# Patient Record
Sex: Male | Born: 1965
Health system: Southern US, Community
[De-identification: ages and names within clinical notes are randomized; demographics above are authoritative.]

## PROBLEM LIST (undated history)

## (undated) DIAGNOSIS — C4490 Unspecified malignant neoplasm of skin, unspecified: Secondary | ICD-10-CM

## (undated) DIAGNOSIS — F419 Anxiety disorder, unspecified: Secondary | ICD-10-CM

## (undated) DIAGNOSIS — G894 Chronic pain syndrome: Secondary | ICD-10-CM

## (undated) DIAGNOSIS — K219 Gastro-esophageal reflux disease without esophagitis: Secondary | ICD-10-CM

## (undated) DIAGNOSIS — I10 Essential (primary) hypertension: Secondary | ICD-10-CM

## (undated) HISTORY — PX: HERNIA REPAIR: SHX51

## (undated) HISTORY — DX: Essential (primary) hypertension: I10

## (undated) HISTORY — PX: ROTATOR CUFF REPAIR: SHX139

## (undated) HISTORY — DX: Anxiety disorder, unspecified: F41.9

## (undated) HISTORY — PX: OTHER SURGICAL HISTORY: SHX169

## (undated) HISTORY — PX: SPINAL FUSION: SHX223

## (undated) HISTORY — DX: Chronic pain syndrome: G89.4

## (undated) HISTORY — DX: Unspecified malignant neoplasm of skin, unspecified: C44.90

## (undated) HISTORY — DX: Gastro-esophageal reflux disease without esophagitis: K21.9

---

## 1998-07-28 ENCOUNTER — Encounter: Admission: RE | Admit: 1998-07-28 | Discharge: 1998-07-28 | Payer: Self-pay | Admitting: *Deleted

## 1998-07-28 ENCOUNTER — Encounter: Payer: Self-pay | Admitting: Emergency Medicine

## 1998-07-28 ENCOUNTER — Emergency Department (HOSPITAL_COMMUNITY): Admission: EM | Admit: 1998-07-28 | Discharge: 1998-07-28 | Payer: Self-pay | Admitting: Emergency Medicine

## 1998-07-29 ENCOUNTER — Encounter: Admission: RE | Admit: 1998-07-29 | Discharge: 1998-10-27 | Payer: Self-pay | Admitting: *Deleted

## 1998-11-15 ENCOUNTER — Ambulatory Visit (HOSPITAL_BASED_OUTPATIENT_CLINIC_OR_DEPARTMENT_OTHER): Admission: RE | Admit: 1998-11-15 | Discharge: 1998-11-15 | Payer: Self-pay | Admitting: General Surgery

## 1999-05-09 ENCOUNTER — Encounter: Admission: RE | Admit: 1999-05-09 | Discharge: 1999-08-07 | Payer: Self-pay | Admitting: Anesthesiology

## 1999-10-03 ENCOUNTER — Encounter: Admission: RE | Admit: 1999-10-03 | Discharge: 1999-10-03 | Payer: Self-pay | Admitting: Family Medicine

## 1999-10-03 ENCOUNTER — Encounter: Payer: Self-pay | Admitting: Family Medicine

## 2000-05-15 ENCOUNTER — Encounter: Payer: Self-pay | Admitting: Family Medicine

## 2000-05-15 ENCOUNTER — Encounter: Admission: RE | Admit: 2000-05-15 | Discharge: 2000-05-15 | Payer: Self-pay | Admitting: Family Medicine

## 2001-07-11 ENCOUNTER — Encounter: Admission: RE | Admit: 2001-07-11 | Discharge: 2001-07-11 | Payer: Self-pay | Admitting: *Deleted

## 2001-07-29 ENCOUNTER — Encounter: Admission: RE | Admit: 2001-07-29 | Discharge: 2001-07-29 | Payer: Self-pay | Admitting: *Deleted

## 2001-08-12 ENCOUNTER — Encounter: Admission: RE | Admit: 2001-08-12 | Discharge: 2001-08-12 | Payer: Self-pay | Admitting: *Deleted

## 2001-08-22 ENCOUNTER — Encounter: Payer: Self-pay | Admitting: Emergency Medicine

## 2001-08-22 ENCOUNTER — Emergency Department (HOSPITAL_COMMUNITY): Admission: EM | Admit: 2001-08-22 | Discharge: 2001-08-23 | Payer: Self-pay | Admitting: Emergency Medicine

## 2001-08-26 ENCOUNTER — Ambulatory Visit (HOSPITAL_COMMUNITY): Admission: RE | Admit: 2001-08-26 | Discharge: 2001-08-26 | Payer: Self-pay | Admitting: *Deleted

## 2001-08-28 HISTORY — PX: CORONARY ANGIOPLASTY: SHX604

## 2001-10-28 ENCOUNTER — Encounter: Admission: RE | Admit: 2001-10-28 | Discharge: 2001-10-28 | Payer: Self-pay | Admitting: Cardiology

## 2001-10-28 ENCOUNTER — Encounter: Payer: Self-pay | Admitting: Cardiology

## 2001-12-16 ENCOUNTER — Encounter: Admission: RE | Admit: 2001-12-16 | Discharge: 2001-12-16 | Payer: Self-pay | Admitting: *Deleted

## 2001-12-16 ENCOUNTER — Emergency Department (HOSPITAL_COMMUNITY): Admission: EM | Admit: 2001-12-16 | Discharge: 2001-12-17 | Payer: Self-pay | Admitting: Emergency Medicine

## 2001-12-16 ENCOUNTER — Encounter: Payer: Self-pay | Admitting: Emergency Medicine

## 2001-12-19 ENCOUNTER — Encounter: Admission: RE | Admit: 2001-12-19 | Discharge: 2001-12-19 | Payer: Self-pay | Admitting: Internal Medicine

## 2002-02-03 ENCOUNTER — Encounter: Admission: RE | Admit: 2002-02-03 | Discharge: 2002-02-03 | Payer: Self-pay | Admitting: Infectious Diseases

## 2002-03-29 ENCOUNTER — Ambulatory Visit (HOSPITAL_COMMUNITY): Admission: RE | Admit: 2002-03-29 | Discharge: 2002-03-29 | Payer: Self-pay | Admitting: *Deleted

## 2002-06-26 ENCOUNTER — Ambulatory Visit (HOSPITAL_COMMUNITY): Admission: RE | Admit: 2002-06-26 | Discharge: 2002-06-26 | Payer: Self-pay | Admitting: *Deleted

## 2003-03-19 ENCOUNTER — Ambulatory Visit (HOSPITAL_COMMUNITY): Admission: RE | Admit: 2003-03-19 | Discharge: 2003-03-19 | Payer: Self-pay | Admitting: Geriatric Medicine

## 2003-09-02 HISTORY — PX: ESOPHAGOGASTRODUODENOSCOPY: SHX1529

## 2003-09-15 ENCOUNTER — Ambulatory Visit (HOSPITAL_COMMUNITY): Admission: RE | Admit: 2003-09-15 | Discharge: 2003-09-15 | Payer: Self-pay | Admitting: Family Medicine

## 2003-09-22 HISTORY — PX: COLONOSCOPY: SHX174

## 2003-09-25 ENCOUNTER — Ambulatory Visit (HOSPITAL_COMMUNITY): Admission: RE | Admit: 2003-09-25 | Discharge: 2003-09-25 | Payer: Self-pay | Admitting: *Deleted

## 2003-10-02 ENCOUNTER — Ambulatory Visit (HOSPITAL_COMMUNITY): Admission: RE | Admit: 2003-10-02 | Discharge: 2003-10-02 | Payer: Self-pay | Admitting: Family Medicine

## 2003-10-15 ENCOUNTER — Ambulatory Visit (HOSPITAL_COMMUNITY): Admission: RE | Admit: 2003-10-15 | Discharge: 2003-10-15 | Payer: Self-pay | Admitting: Internal Medicine

## 2003-10-27 ENCOUNTER — Ambulatory Visit (HOSPITAL_COMMUNITY): Admission: RE | Admit: 2003-10-27 | Discharge: 2003-10-27 | Payer: Self-pay | Admitting: Internal Medicine

## 2003-11-23 ENCOUNTER — Ambulatory Visit (HOSPITAL_COMMUNITY): Admission: RE | Admit: 2003-11-23 | Discharge: 2003-11-23 | Payer: Self-pay | Admitting: Internal Medicine

## 2003-11-28 ENCOUNTER — Ambulatory Visit (HOSPITAL_COMMUNITY): Admission: RE | Admit: 2003-11-28 | Discharge: 2003-11-28 | Payer: Self-pay | Admitting: Internal Medicine

## 2004-07-12 ENCOUNTER — Ambulatory Visit: Payer: Self-pay | Admitting: Family Medicine

## 2004-10-11 ENCOUNTER — Ambulatory Visit: Payer: Self-pay | Admitting: Family Medicine

## 2004-11-17 ENCOUNTER — Ambulatory Visit: Payer: Self-pay | Admitting: Family Medicine

## 2004-11-24 ENCOUNTER — Ambulatory Visit: Payer: Self-pay | Admitting: Family Medicine

## 2005-05-09 ENCOUNTER — Ambulatory Visit: Payer: Self-pay | Admitting: Family Medicine

## 2005-07-07 ENCOUNTER — Ambulatory Visit: Payer: Self-pay | Admitting: Family Medicine

## 2005-09-20 ENCOUNTER — Ambulatory Visit: Payer: Self-pay | Admitting: Family Medicine

## 2005-12-05 ENCOUNTER — Ambulatory Visit (HOSPITAL_BASED_OUTPATIENT_CLINIC_OR_DEPARTMENT_OTHER): Admission: RE | Admit: 2005-12-05 | Discharge: 2005-12-05 | Payer: Self-pay | Admitting: Orthopaedic Surgery

## 2006-01-15 ENCOUNTER — Ambulatory Visit: Payer: Self-pay | Admitting: Family Medicine

## 2006-01-25 ENCOUNTER — Ambulatory Visit: Payer: Self-pay | Admitting: Family Medicine

## 2006-03-07 ENCOUNTER — Encounter: Payer: Self-pay | Admitting: Family Medicine

## 2006-06-06 ENCOUNTER — Ambulatory Visit: Payer: Self-pay | Admitting: Family Medicine

## 2006-08-03 ENCOUNTER — Ambulatory Visit: Payer: Self-pay | Admitting: Family Medicine

## 2006-09-17 ENCOUNTER — Ambulatory Visit: Payer: Self-pay | Admitting: Family Medicine

## 2006-10-02 ENCOUNTER — Ambulatory Visit: Payer: Self-pay | Admitting: Family Medicine

## 2006-10-12 ENCOUNTER — Ambulatory Visit: Payer: Self-pay | Admitting: Family Medicine

## 2006-11-01 ENCOUNTER — Ambulatory Visit: Payer: Self-pay | Admitting: Family Medicine

## 2007-01-10 ENCOUNTER — Ambulatory Visit: Payer: Self-pay | Admitting: Family Medicine

## 2007-02-22 ENCOUNTER — Ambulatory Visit: Payer: Self-pay | Admitting: Physical Medicine & Rehabilitation

## 2007-02-22 ENCOUNTER — Encounter
Admission: RE | Admit: 2007-02-22 | Discharge: 2007-05-23 | Payer: Self-pay | Admitting: Physical Medicine & Rehabilitation

## 2007-04-04 ENCOUNTER — Ambulatory Visit: Payer: Self-pay | Admitting: Physical Medicine & Rehabilitation

## 2007-05-01 ENCOUNTER — Ambulatory Visit (HOSPITAL_COMMUNITY)
Admission: RE | Admit: 2007-05-01 | Discharge: 2007-05-01 | Payer: Self-pay | Admitting: Physical Medicine & Rehabilitation

## 2007-05-15 ENCOUNTER — Ambulatory Visit: Payer: Self-pay | Admitting: Physical Medicine & Rehabilitation

## 2007-05-23 ENCOUNTER — Encounter
Admission: RE | Admit: 2007-05-23 | Discharge: 2007-05-23 | Payer: Self-pay | Admitting: Physical Medicine & Rehabilitation

## 2007-06-10 ENCOUNTER — Encounter: Admission: RE | Admit: 2007-06-10 | Discharge: 2007-09-08 | Payer: Self-pay | Admitting: Anesthesiology

## 2007-06-11 ENCOUNTER — Ambulatory Visit: Payer: Self-pay | Admitting: Anesthesiology

## 2007-06-12 DIAGNOSIS — I10 Essential (primary) hypertension: Secondary | ICD-10-CM

## 2007-06-18 ENCOUNTER — Encounter
Admission: RE | Admit: 2007-06-18 | Discharge: 2007-06-19 | Payer: Self-pay | Admitting: Physical Medicine & Rehabilitation

## 2007-06-18 ENCOUNTER — Ambulatory Visit: Payer: Self-pay | Admitting: Physical Medicine & Rehabilitation

## 2007-06-27 ENCOUNTER — Ambulatory Visit: Payer: Self-pay | Admitting: Family Medicine

## 2007-06-28 ENCOUNTER — Encounter: Payer: Self-pay | Admitting: Family Medicine

## 2007-07-01 LAB — CONVERTED CEMR LAB
ALT: 25 units/L (ref 0–53)
AST: 16 units/L (ref 0–37)
Basophils Relative: 0.8 % (ref 0.0–1.0)
Bilirubin, Direct: 0.2 mg/dL (ref 0.0–0.3)
CO2: 32 meq/L (ref 19–32)
Calcium: 10.3 mg/dL (ref 8.4–10.5)
Chloride: 103 meq/L (ref 96–112)
Eosinophils Relative: 1 % (ref 0.0–5.0)
Glucose, Bld: 86 mg/dL (ref 70–99)
Platelets: 311 10*3/uL (ref 150–400)
RBC: 5.22 M/uL (ref 4.22–5.81)
RDW: 12.1 % (ref 11.5–14.6)
Total Protein: 7.2 g/dL (ref 6.0–8.3)
WBC: 8.9 10*3/uL (ref 4.5–10.5)

## 2007-08-13 ENCOUNTER — Ambulatory Visit: Payer: Self-pay | Admitting: Anesthesiology

## 2007-09-20 ENCOUNTER — Ambulatory Visit: Payer: Self-pay | Admitting: Family Medicine

## 2007-09-20 DIAGNOSIS — K219 Gastro-esophageal reflux disease without esophagitis: Secondary | ICD-10-CM

## 2007-09-20 DIAGNOSIS — F411 Generalized anxiety disorder: Secondary | ICD-10-CM

## 2007-09-20 DIAGNOSIS — R079 Chest pain, unspecified: Secondary | ICD-10-CM

## 2007-09-20 DIAGNOSIS — M546 Pain in thoracic spine: Secondary | ICD-10-CM

## 2007-09-26 ENCOUNTER — Encounter: Admission: RE | Admit: 2007-09-26 | Discharge: 2007-09-26 | Payer: Self-pay | Admitting: Family Medicine

## 2007-09-27 ENCOUNTER — Telehealth: Payer: Self-pay | Admitting: Family Medicine

## 2007-10-02 ENCOUNTER — Encounter: Payer: Self-pay | Admitting: Family Medicine

## 2007-10-14 ENCOUNTER — Encounter: Admission: RE | Admit: 2007-10-14 | Discharge: 2007-10-14 | Payer: Self-pay | Admitting: Orthopaedic Surgery

## 2007-11-11 ENCOUNTER — Telehealth: Payer: Self-pay | Admitting: Family Medicine

## 2007-11-19 ENCOUNTER — Telehealth (INDEPENDENT_AMBULATORY_CARE_PROVIDER_SITE_OTHER): Payer: Self-pay | Admitting: *Deleted

## 2007-11-27 ENCOUNTER — Encounter: Payer: Self-pay | Admitting: Family Medicine

## 2007-12-18 ENCOUNTER — Encounter: Payer: Self-pay | Admitting: Family Medicine

## 2007-12-26 ENCOUNTER — Ambulatory Visit: Payer: Self-pay | Admitting: Family Medicine

## 2008-01-09 ENCOUNTER — Encounter: Payer: Self-pay | Admitting: Family Medicine

## 2008-01-22 ENCOUNTER — Encounter: Payer: Self-pay | Admitting: Family Medicine

## 2008-03-04 ENCOUNTER — Encounter: Payer: Self-pay | Admitting: Family Medicine

## 2008-04-15 ENCOUNTER — Encounter: Payer: Self-pay | Admitting: Family Medicine

## 2008-07-09 ENCOUNTER — Encounter: Payer: Self-pay | Admitting: Family Medicine

## 2008-08-11 ENCOUNTER — Encounter: Payer: Self-pay | Admitting: Family Medicine

## 2008-10-14 ENCOUNTER — Encounter: Admission: RE | Admit: 2008-10-14 | Discharge: 2008-10-14 | Payer: Self-pay | Admitting: Orthopaedic Surgery

## 2008-10-23 ENCOUNTER — Telehealth: Payer: Self-pay | Admitting: Family Medicine

## 2008-12-10 ENCOUNTER — Ambulatory Visit: Payer: Self-pay | Admitting: Family Medicine

## 2008-12-10 LAB — CONVERTED CEMR LAB
Bilirubin Urine: NEGATIVE
Blood in Urine, dipstick: NEGATIVE
Glucose, Urine, Semiquant: NEGATIVE
Ketones, urine, test strip: NEGATIVE
Nitrite: NEGATIVE
Protein, U semiquant: NEGATIVE
Specific Gravity, Urine: 1.015
Urobilinogen, UA: 0.2
WBC Urine, dipstick: NEGATIVE
pH: 7.5

## 2008-12-14 LAB — CONVERTED CEMR LAB
AST: 20 units/L (ref 0–37)
Alkaline Phosphatase: 56 units/L (ref 39–117)
Basophils Absolute: 0 10*3/uL (ref 0.0–0.1)
Bilirubin, Direct: 0.1 mg/dL (ref 0.0–0.3)
Calcium: 9.4 mg/dL (ref 8.4–10.5)
Creatinine, Ser: 0.8 mg/dL (ref 0.4–1.5)
Eosinophils Absolute: 0.1 10*3/uL (ref 0.0–0.7)
GFR calc non Af Amer: 111.98 mL/min (ref >60)
Glucose, Bld: 96 mg/dL (ref 70–99)
HDL: 41.2 mg/dL (ref >39.00)
Hemoglobin: 15.7 g/dL (ref 13.0–17.0)
Lymphocytes Relative: 41.7 % (ref 12.0–46.0)
Monocytes Relative: 6.9 % (ref 3.0–12.0)
Neutrophils Relative %: 48.5 % (ref 43.0–77.0)
Platelets: 221 10*3/uL (ref 150.0–400.0)
RDW: 11.9 % (ref 11.5–14.6)
Sodium: 140 meq/L (ref 135–145)
Total Bilirubin: 0.7 mg/dL (ref 0.3–1.2)
Triglycerides: 84 mg/dL (ref 0.0–149.0)
VLDL: 16.8 mg/dL (ref 0.0–40.0)

## 2008-12-24 ENCOUNTER — Ambulatory Visit: Payer: Self-pay | Admitting: Family Medicine

## 2009-03-30 ENCOUNTER — Ambulatory Visit: Payer: Self-pay | Admitting: Family Medicine

## 2009-06-17 ENCOUNTER — Encounter: Payer: Self-pay | Admitting: Family Medicine

## 2009-07-08 ENCOUNTER — Telehealth: Payer: Self-pay | Admitting: Family Medicine

## 2009-07-08 ENCOUNTER — Encounter (INDEPENDENT_AMBULATORY_CARE_PROVIDER_SITE_OTHER): Payer: Self-pay | Admitting: *Deleted

## 2009-08-13 ENCOUNTER — Encounter (INDEPENDENT_AMBULATORY_CARE_PROVIDER_SITE_OTHER): Payer: Self-pay | Admitting: *Deleted

## 2009-11-04 ENCOUNTER — Encounter: Payer: Self-pay | Admitting: Family Medicine

## 2009-11-25 ENCOUNTER — Telehealth: Payer: Self-pay | Admitting: Family Medicine

## 2010-01-29 ENCOUNTER — Emergency Department (HOSPITAL_COMMUNITY): Admission: EM | Admit: 2010-01-29 | Discharge: 2010-01-29 | Payer: Self-pay | Admitting: Emergency Medicine

## 2010-01-29 ENCOUNTER — Encounter: Payer: Self-pay | Admitting: Family Medicine

## 2010-01-31 ENCOUNTER — Telehealth: Payer: Self-pay | Admitting: Family Medicine

## 2010-02-01 ENCOUNTER — Ambulatory Visit: Payer: Self-pay | Admitting: Family Medicine

## 2010-02-01 DIAGNOSIS — R209 Unspecified disturbances of skin sensation: Secondary | ICD-10-CM

## 2010-02-03 ENCOUNTER — Encounter: Payer: Self-pay | Admitting: Family Medicine

## 2010-03-08 ENCOUNTER — Telehealth: Payer: Self-pay | Admitting: Family Medicine

## 2010-06-13 ENCOUNTER — Encounter: Payer: Self-pay | Admitting: Family Medicine

## 2010-06-14 ENCOUNTER — Ambulatory Visit: Payer: Self-pay | Admitting: Family Medicine

## 2010-06-14 DIAGNOSIS — G5600 Carpal tunnel syndrome, unspecified upper limb: Secondary | ICD-10-CM | POA: Insufficient documentation

## 2010-06-17 ENCOUNTER — Telehealth: Payer: Self-pay | Admitting: Family Medicine

## 2010-06-27 ENCOUNTER — Encounter: Payer: Self-pay | Admitting: Family Medicine

## 2010-09-12 ENCOUNTER — Encounter: Payer: Self-pay | Admitting: Family Medicine

## 2010-09-17 ENCOUNTER — Encounter: Payer: Self-pay | Admitting: Internal Medicine

## 2010-09-18 ENCOUNTER — Encounter: Payer: Self-pay | Admitting: Orthopaedic Surgery

## 2010-09-18 ENCOUNTER — Encounter: Payer: Self-pay | Admitting: Internal Medicine

## 2010-09-27 NOTE — Assessment & Plan Note (Signed)
Summary: ?carpal tunnel/njr   Vital Signs:  Patient profile:   45 year old male Weight:      178 pounds O2 Sat:      97 % Temp:     98.5 degrees F Pulse rate:   74 / minute BP sitting:   110 / 80  (left arm) Cuff size:   regular  Vitals Entered By: Pura Spice, RN (June 14, 2010 8:32 AM) CC: ? carpal tunnel and clonazepam not working   History of Present Illness: Here to discuss problems with both lower arms and hands which have been going on for about 6 months. He describes pain in both elbows which makes it hard to grip objects, and he describes weakness and numbness and tingling in both hands. Dr. Noel Gerold thinks this is from carpal tunnel syndrome and not from his neck or back. Also he wants to go back on Alprazolam since Clonazepam does not help his anxiety very well.   Allergies: 1)  ! * Injectable Steroids 2)  Aceon (Perindopril Erbumine) 3)  Wellbutrin (Bupropion Hcl)  Past History:  Past Medical History: Reviewed history from 12/24/2008 and no changes required. Hypertension Anxiety chronic thoracic back and neck pain, sees Dr. Riley Kill and Dr. Noel Gerold GERD  Past Surgical History: right inguinal Hernia x2 Rotator cuff repair lt and rt Left foot neuroma Heart Catheterization 2003 per Dr. Elsie Lincoln, normal epidural steroid shots to thoracic spine spinal fusion at C6-7 on 12-10-07 per Dr. Sharolyn Douglas, then a repeat fusion on 05-16-10 spinal fusion at L5-S1 on 08-23-09 per Dr. Sharolyn Douglas colonoscopy 09-22-03 per Dr. Marina Goodell, normal EGD 09-02-03 per Dr. Marina Goodell, showed GERD only  Review of Systems  The patient denies anorexia, fever, weight loss, weight gain, vision loss, decreased hearing, hoarseness, chest pain, syncope, dyspnea on exertion, peripheral edema, prolonged cough, headaches, hemoptysis, abdominal pain, melena, hematochezia, severe indigestion/heartburn, hematuria, incontinence, genital sores, muscle weakness, suspicious skin lesions, transient blindness, difficulty  walking, depression, unusual weight change, abnormal bleeding, enlarged lymph nodes, angioedema, breast masses, and testicular masses.    Physical Exam  General:  walks slowly with a cane Neck:  No deformities, masses, or tenderness noted. Msk:  No deformity or scoliosis noted of thoracic or lumbar spine.   Extremities:  very tender over the medial and lateral epicondyles of both elbows. No swelling, full ROM. The hands are intact, Tinels and Phalens are negative.    Impression & Recommendations:  Problem # 1:  LATERAL EPICONDYLITIS (ICD-726.32)  Problem # 2:  MEDIAL EPICONDYLITIS (ICD-726.31)  Problem # 3:  ANXIETY (ICD-300.00)  The following medications were removed from the medication list:    Clonazepam 0.5 Mg Tbdp (Clonazepam) ..... One by mouth two times a day His updated medication list for this problem includes:    Alprazolam 0.5 Mg Tabs (Alprazolam) .Marland Kitchen... Three times a day  Problem # 4:  CARPAL TUNNEL SYNDROME (ICD-354.0)  Orders: Misc. Referral (Misc. Ref)  Complete Medication List: 1)  Nexium 40 Mg Cpdr (Esomeprazole magnesium) .... Once daily 2)  Robaxin-750 750 Mg Tabs (Methocarbamol) .... Once daily 3)  Flector 1.3 % Ptch (Diclofenac epolamine) .... Once daily 4)  Zegerid 40-1100 Mg Caps (Omeprazole-sodium bicarbonate) .... Two times a day 5)  Roxicodone 15 Mg Tabs (Oxycodone hcl) 6)  Alprazolam 0.5 Mg Tabs (Alprazolam) .... Three times a day  Patient Instructions: 1)  Switch from Clonazepam back to Alprazolam. Suggested ice packs and support bands for his epicondylitis. Dr. Noel Gerold will be putting him on some  oral steroids soon, and it old him this would help the epicondylitis as well. I suspect he does have carpal tunnel, so we will set up bilateral nerve conduction studies soon.  Prescriptions: ALPRAZOLAM 0.5 MG TABS (ALPRAZOLAM) three times a day  #90 x 5   Entered and Authorized by:   Nelwyn Salisbury MD   Signed by:   Nelwyn Salisbury MD on 06/14/2010   Method  used:   Print then Give to Patient   RxID:   [id number]    Orders Added: 1)  Est. Patient Level IV [42595] 2)  Misc. Referral [Misc. Ref]

## 2010-09-27 NOTE — Progress Notes (Signed)
  Phone Note Call from Patient   Caller: Patient Call For: Nelwyn Salisbury MD Summary of Call: Please call regarding referral information. [id number] Initial call taken by: Lynann Beaver CMA AAMA,  June 17, 2010 1:59 PM  Follow-up for Phone Call        Called pt and made him aware of the process. Follow-up by: Corky Mull,  June 17, 2010 2:07 PM

## 2010-09-27 NOTE — Progress Notes (Signed)
Summary: refill clonazepam  Phone Note Call from Patient Call back at [phone]   Caller: Patient Reason for Call: Refill Medication Summary of Call: out of clonazepam, pls refill Initial call taken by: VM  Follow-up for Phone Call        I gave him a 6 month supply of Alprazolam in April. Where did Clonazepam come from?  Follow-up by: Nelwyn Salisbury MD,  March 09, 2010 1:31 PM  Additional Follow-up for Phone Call Additional follow up Details #1::        I see Dr. Caryl Never switched him last month. Call in #60 with 2 rf Additional Follow-up by: Nelwyn Salisbury MD,  March 09, 2010 1:36 PM    Prescriptions: CLONAZEPAM 0.5 MG TBDP (CLONAZEPAM) one by mouth two times a day  #60 x 2   Entered by:   Raechel Ache, RN   Authorized by:   Nelwyn Salisbury MD   Signed by:   Raechel Ache, RN on 03/09/2010   Method used:   Printed then faxed to ...       [facility]  [address] [phone]       [address]       [place], [place]  [id number]       Ph: [phone] or [phone]       Fax: [phone]   RxID:   [id number]

## 2010-09-27 NOTE — Letter (Signed)
Summary: [facility]  [facility]   Imported By: Maryln Gottron 11/10/2009 09:30:11  _____________________________________________________________________  External Attachment:    Type:   Image     Comment:   External Document

## 2010-09-27 NOTE — Assessment & Plan Note (Signed)
Summary: er fup-tingling sensation//ccm   Vital Signs:  Patient profile:   45 year old male Temp:     98.3 degrees F oral BP sitting:   130 / 82  (left arm) Cuff size:   regular  Vitals Entered By: Sid Falcon LPN (February 01, 1609 12:00 PM) CC: ER visit 6/4   History of Present Illness: Recent episodes of paresthesias/dysesthesias torso with first episode last Tues lasted about 3 hours.  Second episode Jesse Hanna was more severe and prolonged.  Dr Noel Gerold had advised stopping his alprazolam last Tuesday.  Had received alprazolam of a different color (different manufacturer) and concern raised whether he had a reaction to the alprazolam. On Saturday severe symptoms prompted ER visit and several labs and CT of head normal (these were reviewed).  Pt was felt to be having some anxiety symptoms and prescribed Valium 5 mg three times a day and told to f/u with Dr Clent Ridges.  I am seeing pt today in Dr Claris Che abscense.  Pt only had enough Valium for 5 days.  Allergies: 1)  Aceon 2)  Wellbutrin (Bupropion Hcl)  Past History:  Past Medical History: Last updated: 12/24/2008 Hypertension Anxiety chronic thoracic back and neck pain, sees Dr. Riley Kill and Dr. Noel Gerold GERD  Past Surgical History: Last updated: 12/24/2008 right inguinal Hernia x2 Rotator cuff repair lt and rt Left foot neuroma Heart Catheterization 2003 per Dr. Elsie Lincoln, normal epidural steroid shots to thoracic spine spinal fusion at C6-7 on 12-10-07 per Dr. Sharolyn Douglas colonoscopy 09-22-03 per Dr. Marina Goodell, normal EGD 09-02-03 per Dr. Marina Goodell, showed GERD only  Social History: Last updated: 12/24/2008 Married Alcohol use-no Current Smoker PMH reviewed for relevance, SH/Risk Factors reviewed for relevance   Review of Systems  The patient denies anorexia, fever, weight loss, vision loss, chest pain, syncope, dyspnea on exertion, peripheral edema, prolonged cough, headaches, hemoptysis, abdominal pain, melena, hematochezia, severe  indigestion/heartburn, incontinence, muscle weakness, and suspicious skin lesions.    Physical Exam  General:  Well-developed,well-nourished,in no acute distress; alert,appropriate and cooperative throughout examination Head:  Normocephalic and atraumatic without obvious abnormalities. No apparent alopecia or balding. Eyes:  pupils equal, pupils round, and pupils reactive to light.   Mouth:  Oral mucosa and oropharynx without lesions or exudates.  Teeth in good repair. Neck:  No deformities, masses, or tenderness noted. Chest Wall:  No deformities, masses, tenderness or gynecomastia noted. Lungs:  Normal respiratory effort, chest expands symmetrically. Lungs are clear to auscultation, no crackles or wheezes. Heart:  Normal rate and regular rhythm. S1 and S2 normal without gallop, murmur, click, rub or other extra sounds. Abdomen:  soft and non-tender.   Neurologic:  alert & oriented X3, cranial nerves II-XII intact, strength normal in all extremities, and gait normal.   Skin:  no rashes and no suspicious lesions.   Psych:  Oriented X3, normally interactive, and slightly anxious.     Impression & Recommendations:  Problem # 1:  DYSESTHESIA (ICD-782.0) Assessment New ?anxiety related.  several recent screening labs normal.  Symptoms very episodic.  Problem # 2:  ANXIETY (ICD-300.00) Assessment: Deteriorated Pt very reluctant to go back on alprazolam.  Discussed trial of Klonopin 0.5 mg two times a day and f/u with Dr Clent Ridges in 3-4 weeks. His updated medication list for this problem includes:    Clonazepam 0.5 Mg Tbdp (Clonazepam) ..... One by mouth two times a day  Complete Medication List: 1)  Clonazepam 0.5 Mg Tbdp (Clonazepam) .... One by mouth two times a day  2)  Nexium 40 Mg Cpdr (Esomeprazole magnesium) .... Once daily 3)  Percocet 10-325 Mg Tabs (Oxycodone-acetaminophen) .... As needed 4)  Robaxin-750 750 Mg Tabs (Methocarbamol) .... Once daily 5)  Flector 1.3 % Ptch (Diclofenac  epolamine) .... Once daily 6)  Zegerid 40-1100 Mg Caps (Omeprazole-sodium bicarbonate) .... Two times a day  Patient Instructions: 1)  schedule follow up with Dr Clent Ridges in 3 to 4 weeks. Prescriptions: CLONAZEPAM 0.5 MG TBDP (CLONAZEPAM) one by mouth two times a day  #60 x 0   Entered and Authorized by:   Evelena Peat MD   Signed by:   Evelena Peat MD on 02/01/2010   Method used:   Print then Give to Patient   RxID:   [id number]

## 2010-09-27 NOTE — Progress Notes (Signed)
Summary: allergic reaction  Phone Note Call from Patient   Caller: Patient Call For: Nelwyn Salisbury MD Summary of Call: [phone] [facility]) Pt filled his Xanax, and it was from a different manufacurer...Marland KitchenMarland KitchenMarland Kitchenhe had a allergic reaction with symptoms of a CVA, and was seen in the ER.  Advised it was a reaction from the dye or something in the new Xanax, and needed to DC this.  They gave him Valium 5 mg. one by mouth three times a day, and told to have Dr. Clent Ridges make a decision about a different RX. Initial call taken by: Lynann Beaver CMA,  January 31, 2010 10:54 AM  Follow-up for Phone Call        messageforwarded  to Dr Clent Ridges for advice  Please get him  a post Ed appt for Dr Clent Ridges as soon as possible  Follow-up by: Madelin Headings MD,  January 31, 2010 12:56 PM  Additional Follow-up for Phone Call Additional follow up Details #1::        Pt does not have enough Valium to last until he can see Dr. Clent Ridges. Additional Follow-up by: Lynann Beaver CMA,  January 31, 2010 1:28 PM    Additional Follow-up for Phone Call Additional follow up Details #2::    LMTCB Follow-up by: Raechel Ache, RN,  January 31, 2010 2:23 PM

## 2010-09-27 NOTE — Letter (Signed)
Summary: [facility]  [facility]   Imported By: Maryln Gottron 02/14/2010 15:58:57  _____________________________________________________________________  External Attachment:    Type:   Image     Comment:   External Document

## 2010-09-27 NOTE — Progress Notes (Signed)
Summary: refill xanax  Phone Note Refill Request Message from:  Fax from Pharmacy on November 25, 2009 10:34 AM  Refills Requested: Medication #1:  ALPRAZOLAM 0.5 MG  TABS three times a day as needed anxiety   Dosage confirmed as above?Dosage Confirmed   Supply Requested: 1 month   Last Refilled: 10/29/2009  Method Requested: Telephone to Pharmacy Initial call taken by: Raechel Ache, RN,  November 25, 2009 10:34 AM [facility]  [address] [phone][facility]  Follow-up for Phone Call        No, I gave him a 6 month supply just 4 months ago Follow-up by: Nelwyn Salisbury MD,  November 25, 2009 11:42 AM  Additional Follow-up for Phone Call Additional follow up Details #1::        Rx denial called/faxed to pharmacy Additional Follow-up by: Raechel Ache, RN,  November 25, 2009 11:53 AM

## 2010-09-27 NOTE — Letter (Signed)
Summary: [facility]  [facility]   Imported By: Maryln Gottron 02/14/2010 11:12:53  _____________________________________________________________________  External Attachment:    Type:   Image     Comment:   External Document

## 2010-09-27 NOTE — Letter (Signed)
Summary: [facility]  [facility]   Imported By: Maryln Gottron 06/21/2010 13:04:56  _____________________________________________________________________  External Attachment:    Type:   Image     Comment:   External Document

## 2010-09-27 NOTE — Progress Notes (Signed)
Summary: xanax  Phone Note Call from Patient   Caller: Patient Summary of Call: Pt is calling back and wants Dr Clent Ridges to know the pharmacy is faxing a copy of his last Xanax prescription for review.   Initial call taken by: Lynann Beaver CMA,  November 25, 2009 3:48 PM  Follow-up for Phone Call        I will look at this when it comes Follow-up by: Nelwyn Salisbury MD,  November 26, 2009 9:24 AM     Appended Document: xanax the pharmacy clearly made an error. call in refills for Xanax as above, #90 with 5 rf  Appended Document: [facility]

## 2010-10-05 NOTE — Letter (Signed)
Summary: [facility]  [facility]   Imported By: Maryln Gottron 09/27/2010 14:49:51  _____________________________________________________________________  External Attachment:    Type:   Image     Comment:   External Document

## 2010-10-27 ENCOUNTER — Emergency Department (HOSPITAL_COMMUNITY)
Admission: EM | Admit: 2010-10-27 | Discharge: 2010-10-27 | Disposition: A | Discharge status: Home / Self Care | Payer: BC Managed Care – PPO | Attending: Emergency Medicine | Admitting: Emergency Medicine

## 2010-10-27 ENCOUNTER — Emergency Department (HOSPITAL_COMMUNITY): Payer: BC Managed Care – PPO

## 2010-10-27 DIAGNOSIS — R209 Unspecified disturbances of skin sensation: Secondary | ICD-10-CM | POA: Insufficient documentation

## 2010-10-27 DIAGNOSIS — M549 Dorsalgia, unspecified: Secondary | ICD-10-CM | POA: Insufficient documentation

## 2010-10-27 DIAGNOSIS — I10 Essential (primary) hypertension: Secondary | ICD-10-CM | POA: Insufficient documentation

## 2010-10-27 DIAGNOSIS — Z79899 Other long term (current) drug therapy: Secondary | ICD-10-CM | POA: Insufficient documentation

## 2010-10-27 DIAGNOSIS — R42 Dizziness and giddiness: Secondary | ICD-10-CM | POA: Insufficient documentation

## 2010-10-27 DIAGNOSIS — G8929 Other chronic pain: Secondary | ICD-10-CM | POA: Insufficient documentation

## 2010-10-27 LAB — POCT I-STAT, CHEM 8
BUN: 7 mg/dL (ref 6–23)
Calcium, Ion: 1.16 mmol/L (ref 1.12–1.32)
Chloride: 105 meq/L (ref 96–112)
Creatinine, Ser: 0.9 mg/dL (ref 0.4–1.5)
Glucose, Bld: 119 mg/dL — ABNORMAL HIGH (ref 70–99)
HCT: 48 % (ref 39.0–52.0)
Hemoglobin: 16.3 g/dL (ref 13.0–17.0)
Potassium: 4.1 meq/L (ref 3.5–5.1)
Sodium: 142 meq/L (ref 135–145)
TCO2: 25 mmol/L (ref 0–100)

## 2010-11-09 ENCOUNTER — Other Ambulatory Visit: Payer: Self-pay | Admitting: Family Medicine

## 2010-11-14 LAB — POCT I-STAT, CHEM 8
BUN: 11 mg/dL (ref 6–23)
Calcium, Ion: 1.17 mmol/L (ref 1.12–1.32)
Chloride: 108 mEq/L (ref 96–112)
Creatinine, Ser: 0.7 mg/dL (ref 0.4–1.5)
Glucose, Bld: 99 mg/dL (ref 70–99)
HCT: 45 % (ref 39.0–52.0)
Hemoglobin: 15.3 g/dL (ref 13.0–17.0)
Potassium: 3.6 mEq/L (ref 3.5–5.1)
Sodium: 142 meq/L (ref 135–145)
TCO2: 26 mmol/L (ref 0–100)

## 2010-11-14 LAB — POCT CARDIAC MARKERS
CKMB, poc: <1 ng/mL — ABNORMAL LOW (ref 1.0–8.0)
Myoglobin, poc: 56 ng/mL (ref 12–200)
Troponin i, poc: <0.05 ng/mL (ref 0.00–0.09)

## 2010-11-14 LAB — DIFFERENTIAL
Basophils Absolute: 0 10*3/uL (ref 0.0–0.1)
Basophils Relative: 1 % (ref 0–1)
Eosinophils Absolute: 0 10*3/uL (ref 0.0–0.7)
Monocytes Relative: 4 % (ref 3–12)
Neutro Abs: 6.8 10*3/uL (ref 1.7–7.7)
Neutrophils Relative %: 72 % (ref 43–77)

## 2010-11-14 LAB — CBC
MCHC: 34.8 g/dL (ref 30.0–36.0)
MCV: 90.4 fL (ref 78.0–100.0)
Platelets: 262 10*3/uL (ref 150–400)
RBC: 4.81 MIL/uL (ref 4.22–5.81)
RDW: 12.9 % (ref 11.5–15.5)

## 2010-12-06 ENCOUNTER — Other Ambulatory Visit: Payer: Self-pay

## 2010-12-06 MED ORDER — ALPRAZOLAM 0.5 MG PO TABS: 0.5000 mg | ORAL_TABLET | Freq: Every evening | ORAL | Status: AC | PRN

## 2010-12-06 NOTE — Telephone Encounter (Signed)
Call in #30 with 5 rf 

## 2010-12-06 NOTE — Telephone Encounter (Signed)
Called rx in and pt aware.

## 2010-12-06 NOTE — Telephone Encounter (Signed)
[facility] al[facility]

## 2010-12-07 ENCOUNTER — Ambulatory Visit (INDEPENDENT_AMBULATORY_CARE_PROVIDER_SITE_OTHER): Payer: BC Managed Care – PPO | Admitting: Family Medicine

## 2010-12-07 ENCOUNTER — Encounter: Payer: Self-pay | Admitting: Family Medicine

## 2010-12-07 VITALS — BP 120/70 | Temp 98.7°F | Ht 71.5 in | Wt 183.0 lb

## 2010-12-07 DIAGNOSIS — J019 Acute sinusitis, unspecified: Secondary | ICD-10-CM

## 2010-12-07 MED ORDER — AMOXICILLIN 875 MG PO TABS: 875.0000 mg | ORAL_TABLET | Freq: Two times a day (BID) | ORAL | Status: AC

## 2010-12-07 NOTE — Progress Notes (Signed)
  Subjective:    Patient ID: Jesse Hanna, male    DOB: 1965/11/24, 45 y.o.   MRN: [id number]  HPI Patient is seen with possible sinus infection. Start with allergies about 3 weeks ago. Now over the past week progressive cough and yellowish nasal discharge along with fever up to 101. He is having increased frontal sinus pressure and occasional bloody nasal discharge. Has tried over-the-counter Mucinex and decongestants without improvement. Also developing sore throat past couple days   Review of Systems  Constitutional: Positive for fever, chills and fatigue.  HENT: Positive for congestion, sore throat and postnasal drip. Negative for voice change.   Respiratory: Positive for cough. Negative for shortness of breath.   Cardiovascular: Negative for chest pain.  Neurological: Positive for headaches.  Hematological: Negative for adenopathy.       Objective:   Physical Exam  Constitutional: He appears well-developed and well-nourished. No distress.  HENT:  Right Ear: External ear normal.  Left Ear: External ear normal.  Mouth/Throat: Oropharynx is clear and moist. No oropharyngeal exudate.  Eyes: Right eye exhibits no discharge. Left eye exhibits no discharge.  Neck: Neck supple.  Cardiovascular: Normal rate, regular rhythm and normal heart sounds.   No murmur heard. Pulmonary/Chest: Effort normal and breath sounds normal. He has no wheezes. He has no rales.  Musculoskeletal: He exhibits no edema.  Lymphadenopathy:    He has no cervical adenopathy.          Assessment & Plan:  Acute sinusitis. Amoxicillin 875 mg twice a day for 10 days

## 2010-12-07 NOTE — Patient Instructions (Signed)

## 2011-01-10 NOTE — Assessment & Plan Note (Signed)
Jesse Hanna is back regarding his low back and left-sided pain.  He has  been in physical therapy and has found the massage therapy to be  beneficial, especially as of late, but notes that, when he goes back to  work, he only exacerbates his pain and the pain returns.  He had some  relief temporarily with the trigger point injections we did around the  rhomboids, but continued to have the pain once again after about 24  hours.  The pain below the inferior border of the scapula seems a bit  improved, although the pain closer to the spine and distal to the  scapula is still a significant problem.  He uses the Lortab three to  four times a day for break-through pain.   The pain is described as burning, constant and aching.  Pain interferes  with his general activity, relations with others, enjoyment of life on a  moderate level.   REVIEW OF SYSTEMS:  Notable for the above.  Patient denies any bowel or  bladder issues.  Sleep is fair.  Mood is fair, as well.   SOCIAL HISTORY:  Patient is married and lives with his wife and  children.  He still smokes a half pack of cigarettes per day.  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   PHYSICAL EXAM:  Blood pressure is 139/80, pulse is 48, respiratory rate  18, he is satting 100% on room air.  Patient is pleasant, alert and oriented times three.  Affect is bright  and appropriate.  Posture is fair.  He still has some depression of his left scapula with medial deviation,  but this seems a bit improved from last visit.  Rhomboids are less  tender today and he has more tenderness in the lower latissimus dorsi  muscles, as well as the paraspinals at T7, T8, T9.  Patient also had  pain with palpation, as well as twisting and bending.  Extension caused  some discomfort, as well.  Continued displaying a kyphotic thoracic  posture today.  Strength is 5/5.  Sensory exam is intact.  Mild  levoscoliosis of the mid-thoracic spine is also noted.  HEART:  Regular.  CHEST:  Clear.  ABDOMEN:  Soft, nontender.   ASSESSMENT:  1. Thoracic levoscoliosis and kyphosis with secondary postural pain.  2. Myofascial thoracic muscle pain.  3. Persistent pain in the thoracic spine, refractory to multiple      measures.  Need to rule out thoracic facet or disc pathology.   PLAN:  1. We will send patient for an MRI of the thoracic spine to rule out      facet or disc disease.  2. I injected the thoracic paraspinals at T7, T8, with 4 mL of 1%      lidocaine.  The patient tolerated it well.  We will observe for      response.  3. I would like the patient to continue with his physical therapy to      work on posture, myofascial techniques, etc.  He needs to also      discuss work technique and things he can do  to avoid exacerbating      his pain while on the job.  4. For now, we will keep him out of work for approximately 60 days to      see if this time off will allow him to      recuperate.  He seems to exacerbate his symptoms every  time he      returns to work.  5. I will see him back in about one month's time.      Ranelle Oyster, M.D.  Electronically Signed     ZTS/MedQ  D:  05/17/2007 14:05:53  T:  05/17/2007 16:20:26  Job #:  [id number]   cc:   Jeannett Senior A. Clent Ridges, MD  [address] [place]  [place] [address]

## 2011-01-10 NOTE — Assessment & Plan Note (Signed)
Izak is back regarding his back pain.  I sent him for a T6/T7  epidural injection today by Dr. Tonny Bollman.  The patient reports good  results with this.  States that his pain is down to a 4/10.  He has some  pain at the injection site more or less but the site around his shoulder  blade and across around the chest seems to be improved.  The patient did  have a reaction, however, to the steroid apparently.  I recommended  Benadryl if he persisted with a rash.  The patient has not been back to  work.  He has been doing some physical therapy.  His sleep is fair.   REVIEW OF SYSTEMS:  The patient denies any new issues other than those  mentioned above.  Full review is in the written health and history  section of the chart.   SOCIAL HISTORY:  The patient is married, living with his wife and two  children.  He is still smoking a pack of cigarettes per day.   PHYSICAL EXAMINATION:  VITALS:  Blood pressure is 145/89, pulse is 100,  respirations 18.  He is sating 100% on room air.  GENERAL:  The patient is pleasant, alert and oriented x3.  Affect is  bright and appropriate.  Gait stable.  MUSCULOSKELETAL:  The patient has some muscle spasm of the right T5  through T7 paraspinal muscles.  Tender upon palpation today as well as  some twisting.  No focal muscle weakness was noted today.  Sensory exam  was generally intact in both upper extremities.  NEURO:  Cognitively he was appropriate.  BACK:  Posture was fair.  He still has mild level of scoliosis of the  mid thoracic spine as well as a kyphotic thoracic posture.  HEART:  Tachycardic.  CHEST:  Clear.  ABDOMEN:  Soft, nontender.  SKIN:  The patient had diffuse papular rash over the back and the hands  and wrists.   ASSESSMENT:  1. Chronic thoracic pain likely related to T6, T7 left paracentral      disk extrusion.  This has responded well to injection.  2. Postural dysfunction of the thoracic spine and shoulder girdle.   PLAN:  1.  Recommend follow-up injection with Dr. Tonny Bollman in the next week or      two.  Will stay away from using Aristocort as he apparently had      reaction to this.  2. The patient as given a Medrol Dosepak to improve allergic reaction      symptoms.  3. Continue hydrocodone 7.5 for breakthrough pain.  4. The patient needs to remain out of work at this point.  It is my      hope with some time to rest and with these injections that his pain      will continue to improve.  I would be concerned over the long haul      of him exacerbating his symptoms with the return to the level of      work he had previously been involved in.  It does not sound as if      there are other options for lighter duty at his workplace.  He will      need to discuss these issues with his      employer going forward.  I will see the patient back in      approximately a month's time after he completes the next injection.  Ranelle Oyster, M.D.  Electronically Signed     ZTS/MedQ  D:  06/19/2007 12:12:21  T:  06/20/2007 08:26:06  Job #:  [id number]   cc:   Jeannett Senior A. Clent Ridges, MD  [address] [place]  [place] [address]

## 2011-01-10 NOTE — Assessment & Plan Note (Signed)
[name] is back regarding his shoulder pain.  He states that over  the last couple days he has had a bit of a flare up.  He is not sure  ultimately of the source.  He may have been doing a bit more work  perhaps.  He did feel that the Mobic and Lidoderm patch has been helpful  though Mobic has been making his stomach upset.  He does state that he  was taking the Mobic on an empty stomach drinking coffee only.  He has  not been to therapy yet.  He did bring a list of his available therapy  centers under his insurance plan with him today.  Patient rates his pain  at 7-8 out of 10.  Describes it as burning, aching and constant.  Pain  seems to be centered around the angle of the left scapula radiating  across to the right side and as well down the left back and flank.  Pain  interfered with general activity, relations with others and enjoyment of  life on a moderate level.   REVIEW OF SYSTEMS:  Patient denies any new issues other than those  mentioned above.  Full review is in the health and history section of  the chart.   SOCIAL HISTORY:  Patient is married and lives with his wife and  children.  Still smokes a 1/2 pack of cigarettes per day.   PHYSICAL EXAMINATION:  Blood pressure is 123/82, pulse is 85,  respiratory rate 18, he is sating 97% room air.  Patient is pleasant,  alert and oriented x3, affect is bright and appropriate.  Rotator cuff  signs are negative on either shoulder essentially today.  The left  scapula appears to be depressed and medially deviated comparatively to  the right.  He had taught bands of muscle along the mid to lower  rhomboids which reproduce pain upon palpation today.  He had less  palpational tenderness at inferior border of the scapula as well as  along the lateral edge.  I palpated no frank thoracic spine tenderness.  Patient continues to have a kyphotic thoracic posture with both  shoulders rotated inward and head is forward.  Strength is  generally a 5  out of 5.  Sensory exam is intact.  Did note a mild levoscoliosis in the  mid thoracic spine again today.  Cognitively patient is intact.  HEART:  Regular.  CHEST:  Clear.  ABDOMEN:  Soft, nontender.   ASSESSMENT:  1. Thoracic levoscoliosis and kyphosis with secondary postural pain.  2. Myofascial pain centered around the left scapula and related to #1.      Rhomboids appear to be most involved today.  3. History of bilateral rotator cuff disease status post arthroscopic      surgery and intermittent bilateral impingement signs.   PLAN:  1. After informed consent we injected 3 trigger points along the left      rhomboid in each using 2 cc 1% lidocaine.  Patient was feeling      relief after leaving the office.  We seemed to really strike a      sensitive trigger point on the second of the 3 injections.  2. Continue Mobic and Lidoderm as prescribed.  I encouraged him to      take his Mobic with milk or food.  Refilled Lortab earlier this      week.  3. Will send patient to outpatient physical therapy to work on the  myofascial pain and posture.  Will use myofascial release      technique, Kinesio tape and other modalities as indicated.  4. Keep patient on essentially light duty activity for now at work.  5. I will see him back in 4-6 week's time.      Ranelle Oyster, M.D.  Electronically Signed     ZTS/MedQ  D:  04/05/2007 15:27:55  T:  04/06/2007 10:06:20  Job #:  [id number]   cc:   Jeannett Senior A. Clent Ridges, MD  [address] [place]  [place] [address]

## 2011-01-10 NOTE — Procedures (Signed)
NAMESHAMAL, STRACENER            ACCOUNT NO.:  [id number]   MEDICAL RECORD NO.:  [id number]           PATIENT TYPE:   LOCATION:                                 FACILITY:   PHYSICIAN:  Celene Kras, MD             DATE OF BIRTH:   DATE OF PROCEDURE:  08/13/2007  DATE OF DISCHARGE:                               OPERATIVE REPORT   1. [facility]      benefiting from intervention.  He does have a left paracentral disk      extrusion, T6-7, and I am going to go ahead and revisit the      thoracic epidural.  2. He wants to return to work and he is improving to the point I think      he can probably do so.  Will give him some modest restrictions      here.  3. If he is having any other problems along these lines, he will let      [place] know.  I have reviewed the risks, complications and options,      consider addressing the facet.   OBJECTIVELY:  Diffuse perithoracic myofascial discomfort, positive  myofascial components, particularly with side bending.  Pain with  extension.  Nothing new neurologically.  Typical pain, diskogenic,  modest radicular overlay, left side that has responded.   IMPRESSION:  Thoracic disk extrusion, __________ spinal disease,  thoracic spine.   PLAN:  1. Thoracic epidural.  He is consented.  Consider another intervention      if warranted.  2. Modifiable features in health profile such as cigarette cessation      is urged.  3. Follow him expectantly.  Questions are answered.  I have used      models and discussed in lay terms.   The patient to operative suite and placed in the prone position.  Back  prepped, draped in usual fashion.  Using a Hustead needle, I advanced to  the T6-7 interspace under local anesthetic without any evidence of CSF,  heme or paresthesia.  Test block uneventfully.  Followup with 40 mg  Aristocort and flush needle.   Tolerated procedure well.  No complications from procedure.   Appropriate  recovery.  Discharge instructions given.           ______________________________  Celene Kras, MD     HH/MEDQ  D:  08/13/2007 10:22:19  T:  08/13/2007 13:38:08  Job:  [id number]

## 2011-01-10 NOTE — Procedures (Signed)
NAMEVELMER, Jesse Hanna            ACCOUNT NO.:  [id number]   MEDICAL RECORD NO.:  [id number]          PATIENT TYPE:  REC   LOCATION:  [facility]                          FACILITY:  [facility]   PHYSICIAN:  Celene Kras, MD        DATE OF BIRTH:  Mar 02, 1966   DATE OF PROCEDURE:  06/11/2007  DATE OF DISCHARGE:                               OPERATIVE REPORT   Eutimio Gharibian comes to the center pain management today, to evaluate  his health and history form and 14-point review of systems.   Coleman is a pleasant individual who has been followed expectantly by  Dr. Riley Kill, recently disabled temporarily secondary to pain.  Notable  pain to the left, functional impairment secondary to pain.  MRI suggests  thoracic degenerative disease.  He is a longstanding patient with the  system, pain clinic has treated his inguinal nerve, and he is had  difficulty with most activities of daily living, without minimally  invasive interventions.  These have included a trigger point  intervention, medication readjustments, and other functional  enhancements.  Dr. Riley Kill has placed him on work restrictions, and  scheduled for reevaluation.  Pain is primarily to the left side,  described as both a mechanical and diskogenic pain.  He is starting to  break through conservative management.  We have been asked to consider  intervention.   Most problematic radicular pain seems to be to the left side, it is  described as between T6-T9, most prominent T6-7 by descriptor indices.  He does not have any neurological deficit.  He does have difficulty with  range of motion activities.  Particularly lifting.  He is also  describing extensive myofascial extension.  Nothing new neurologically.  His medications are appropriate, he is using them sparingly.   Objectively diffuse parathoracic myofascial discomfort directed left  greater than right with side bending positive, and pain with extension.  A mixed mechanical diskogenic  pain.  Some modest radicular pain, nothing  new neurologically.   IMPRESSION:  __________  positive thoracic spine, with facet syndrome.   PLAN:  1. There is a broad brush stroke to a generalized approach to managing      his pain.  It is reasonable to go on to thoracic epidural.  I will      go to T6-7 and follow him expectantly.  Consider addressing the      facet.  2. Other modifiable features in health profile.  Consider muscle      stimulator for reeducation.  3. Home based therapy.  We will defer to our physiatry colleagues in      this regard.  He is consented for today's procedure.  I have      discussed this in lay terms, I have used models, questions were      answered.  No barrier to communication.   PLAN:  Thoracic epidural, he is consented.   The patient taken to fluoroscopic suite and placed in the prone  position.  The back prepped and draped in the usual fashion.  Using a  Husted needle I advanced under direct fluoroscopic  observation to T6-7  interspace without any evidence of CSF, heme, or paresthesias.  Test  block uneventfully followed by 40 mg of Aristocort and flushed needle.   Tolerated the procedure well.  No complications from our procedure.  Appropriate recovery.  Discharge instructions given.           ______________________________  Celene Kras, MD     HH/MEDQ  D:  06/11/2007 10:26:46  T:  06/11/2007 18:33:53  Job:  [id number]

## 2011-01-10 NOTE — Procedures (Signed)
Jesse Hanna            ACCOUNT NO.:  [id number]   MEDICAL RECORD NO.:  [id number]          PATIENT TYPE:  REC   LOCATION:  [facility]                          FACILITY:  [facility]   PHYSICIAN:  Celene Kras, MD        DATE OF BIRTH:  1966-01-09   DATE OF PROCEDURE:  DATE OF DISCHARGE:                               OPERATIVE REPORT   Jesse Hanna comes into pain management today. __________  and  history form and 14-point review of systems.   Excellent response to thoracic epidural, 3 weeks, complete diminution  pain perception.  He is starting to recrudesce.  It is reasonable to go  on to another intervention but in the interim, he had a rash develop,  and felt to be originally related to the intervention, but ultimately it  turns out to be psoriatic, with elements of possible infectious  etiology.  He is on an antibiotic at this time, and therefore an  elective procedure will be withheld until he has been cleared by primary  care.  He is afebrile and essentially asymptomatic, but his primary care  has him on a course of cephalexin.   I reviewed this with him.  We can see him at another officer or here in  a few weeks, but he is a responder and we would like to continue with  the intervention.  I have reviewed his other medications.  Questions  were answered.  I have discussed in lay terms and reviewed this with  him.   Objectively, diffuse parathoracic myofascial __________  side bending,  pain with extension.  Nothing new neurologically.  He has typical pain.  Mechanical, probable diskogenic thoracic spine.   IMPRESSION:  Degenerative spine disease, thoracic spine.   PLAN:  Thoracic epidural and followup.  Discharge instructions given.  Clearance from primary care.           ______________________________  Celene Kras, MD     HH/MEDQ  D:  07/16/2007 10:43:59  T:  07/16/2007 16:17:16  Job:  [id number]

## 2011-01-13 NOTE — Op Note (Signed)
Jesse Hanna, Jesse Hanna            ACCOUNT NO.:  [id number]   MEDICAL RECORD NO.:  [id number]          PATIENT TYPE:  AMB   LOCATION:  [facility]                          FACILITY:  [facility]   PHYSICIAN:  Lubertha Basque. Dalldorf, M.D.DATE OF BIRTH:  12/27/1965   DATE OF PROCEDURE:  12/05/2005  DATE OF DISCHARGE:                                 OPERATIVE REPORT   PREOPERATIVE DIAGNOSES:  1.  Right shoulder impingement.  2.  Right shoulder partial rotator cuff tear.   POSTOPERATIVE DIAGNOSES:  1.  Right shoulder impingement.  2.  Right shoulder partial rotator cuff tear.   PROCEDURES:  1.  Right shoulder arthroscopic acromioplasty.  2.  Right shoulder arthroscopic debridement.   ANESTHESIA:  General and block.   ATTENDING SURGEON:  Lubertha Basque. Jerl Santos, M.D.   INDICATIONS FOR PROCEDURE:  The patient is a 45 year old man with a long  history of right shoulder pain.  This has persisted despite oral anti-  inflammatories and an exercise program.  He did achieve several weeks of  relief after a subacromial injection.  He has persisted with difficulty  which limits his ability he uses arm at home and on the job and also limits  his ability to rest well at night.  He is offered an arthroscopy.  Informed  operative consent was obtained after discussion of possible complications of  reaction to anesthesia and infection.  He has had a successful arthroscopy  of the opposite shoulder done many years ago.   DESCRIPTION OF PROCEDURE:  The patient was taken to the operating suite  where general anesthetic was applied without difficulty.  He was also given  a block in the preanesthesia area.  He was positioned in beach-chair  position and prepped and draped in normal sterile fashion.  After infusion  of IV Kefzol, an arthroscopy of the right shoulder was performed through a  total of two portals.  Glenohumeral joint showed no degenerative changes and  the biceps tendon and rotator cuff appeared benign from  below.  In the  subacromial space, he had a great deal of bursitis addressed with a thorough  bursectomy.  He did have a small partial-thickness rotator cuff tear  addressed with a brief debridement but certainly no substantial or full-  thickness tear was seen.  He had a prominent subacromial morphology  addressed with an acromioplasty back to a flat surface.  This was done with  the bur in the lateral position followed by transfer of the bur to the  posterior position.  [facility] joint was nontender preoperatively and appeared  benign intraoperatively and we did not address this in any way.  The  shoulder was thoroughly irrigated.  Simple sutures of nylon were used to  loosely reapproximate the portals followed by Adaptic and a dry gauze  dressing with tape.  Estimated blood loss and intraoperative fluids can be  obtained from anesthesia records.   DISPOSITION:  The patient was extubated in the operating room and taken to  the recovery room in stable addition.  Plans were for him to go home the  same day and follow up  in the office in less than a week.  I will contact  him by phone tonight.      Lubertha Basque Jerl Santos, M.D.  Electronically Signed     PGD/MEDQ  D:  12/05/2005  T:  12/05/2005  Job:  [id number]

## 2011-04-13 ENCOUNTER — Encounter: Payer: Self-pay | Admitting: Family Medicine

## 2011-04-14 ENCOUNTER — Ambulatory Visit (INDEPENDENT_AMBULATORY_CARE_PROVIDER_SITE_OTHER): Payer: BC Managed Care – PPO | Admitting: Family Medicine

## 2011-04-14 ENCOUNTER — Encounter: Payer: Self-pay | Admitting: Family Medicine

## 2011-04-14 VITALS — BP 124/80 | HR 88 | Temp 98.2°F | Wt 176.0 lb

## 2011-04-14 DIAGNOSIS — J329 Chronic sinusitis, unspecified: Secondary | ICD-10-CM

## 2011-04-14 DIAGNOSIS — R1013 Epigastric pain: Secondary | ICD-10-CM

## 2011-04-14 MED ORDER — AMOXICILLIN 875 MG PO TABS: 875.0000 mg | ORAL_TABLET | Freq: Two times a day (BID) | ORAL | Status: AC

## 2011-04-14 MED ORDER — ALPRAZOLAM 0.25 MG PO TABS: 0.2500 mg | ORAL_TABLET | Freq: Three times a day (TID) | ORAL | Status: DC | PRN

## 2011-04-14 NOTE — Progress Notes (Signed)
  Subjective:    Patient ID: Jesse Hanna, male    DOB: [birth year], 44 y.o.   MRN: [id number]  HPI Here for 2 weeks of sinus pressure, PND, HA, and a ST. No fever or cough. Also he has had more epigastric pains over the past month or so, this despite taking Zegerid bid. He gets nauseated after meals but does not vomit. BMs are normal. He had upper and lower endoscopy in 2005 per Dr. Marina Goodell.    Review of Systems  Constitutional: Negative.   HENT: Positive for congestion, postnasal drip and sinus pressure.   Eyes: Negative.   Respiratory: Negative.   Gastrointestinal: Positive for nausea and abdominal pain. Negative for vomiting, diarrhea, constipation, blood in stool and abdominal distention.  Neurological: Positive for headaches.       Objective:   Physical Exam  Constitutional: He appears well-developed and well-nourished.  HENT:  Right Ear: External ear normal.  Left Ear: External ear normal.  Nose: Nose normal.  Mouth/Throat: Oropharynx is clear and moist. No oropharyngeal exudate.  Eyes: Conjunctivae are normal. Pupils are equal, round, and reactive to light.  Neck: No thyromegaly present.  Pulmonary/Chest: Effort normal and breath sounds normal.  Abdominal: Soft. Bowel sounds are normal. He exhibits no distension and no mass. There is no tenderness. There is no rebound and no guarding.  Lymphadenopathy:    He has no cervical adenopathy.          Assessment & Plan:  Treat  the sinusitis with Amoxicillin. Refer back to Dr. Marina Goodell for the abdominal pains

## 2011-05-04 ENCOUNTER — Telehealth: Payer: Self-pay | Admitting: Internal Medicine

## 2011-05-04 NOTE — Telephone Encounter (Signed)
Left message for pt to call back  °

## 2011-05-05 NOTE — Telephone Encounter (Signed)
Pt scheduled to see Amy Esterwood PA 05/09/11@3 :30pm. Pt aware of appt date and time. Dr. Marina Goodell is the supervising physician this afternoon.

## 2011-05-09 ENCOUNTER — Other Ambulatory Visit (INDEPENDENT_AMBULATORY_CARE_PROVIDER_SITE_OTHER): Payer: BC Managed Care – PPO

## 2011-05-09 ENCOUNTER — Ambulatory Visit (INDEPENDENT_AMBULATORY_CARE_PROVIDER_SITE_OTHER): Payer: BC Managed Care – PPO | Admitting: Physician Assistant

## 2011-05-09 ENCOUNTER — Encounter: Payer: Self-pay | Admitting: Physician Assistant

## 2011-05-09 VITALS — BP 120/84 | HR 84 | Ht 72.0 in | Wt 177.6 lb

## 2011-05-09 DIAGNOSIS — R110 Nausea: Secondary | ICD-10-CM

## 2011-05-09 DIAGNOSIS — R1013 Epigastric pain: Secondary | ICD-10-CM

## 2011-05-09 MED ORDER — ONDANSETRON HCL 4 MG PO TABS: ORAL_TABLET | ORAL | Status: DC

## 2011-05-09 NOTE — Patient Instructions (Addendum)
One of your biggest health concerns is your smoking.  This increases your risk for most cancers and serious cardiovascular diseases such as strokes, heart attacks.  You should try your best to stop.  If you need assistance, please contact your PCP or Smoking Cessation Class at [facility] [phone]) or [facility] Quit-Line (1-800-QUIT-NOW).  Please go to the basement level to have your labs drawn.  We have scheduled the Ultrasound at [facility]. Arrive at 8:15 Am. Have nothing to eat or drink after midnight.

## 2011-05-09 NOTE — Progress Notes (Signed)
Agree with initial assessment and plans as outlined 

## 2011-05-09 NOTE — Progress Notes (Signed)
Subjective:    Patient ID: Jesse Hanna, male    DOB: 08/02/1966, 45 y.o.   MRN: [id number]  HPI "Jesse Hanna" is a 45 year old white male referred today by Dr. Clent Ridges, known previously to Dr. Marina Goodell from workup in 2005. He is being referred today for complaints of epigastric pain and nausea. He did undergo an endoscopy and colonoscopy in 2005, the EGD showed evidence of reflux and the colonoscopy was normal. Patient relates that he has been on PPI therapy for many years and has been on Zegerid twice daily over the past couple of years. Over the past 6 weeks he has been having difficulty with recurrent epigastric and substernal pain which seems to be associated with nausea. Over the past week or so he has had constant nausea which is lasting much of the day He has also noticed noted increase in burping and belching and some radiation of discomfort to his mid back. Is no complaint of dysphagia or odynophagia. He does stay constipated due to his pain meds but has not noted any changes and denies any melena or hematochezia. He does not use any regular aspirin or NSAIDs and does not use alcohol.  He describes episodes of severe nausea occurring 20-30 minutes after meals and then gradually easing off He has not had any documented fever or chills. He says he had similar symptoms back in 2005 and underwent several different test with Dr. Marina Goodell with no definite conclusion, and does think that he took a course of antibiotics to treat for H. pylori at that time as well., He also notes that he was on antibiotics in August for sinus infection and says that his stomach felt better during that time.    Review of Systems  Constitutional: Negative.   HENT: Negative.   Eyes: Negative.   Respiratory: Negative.   Cardiovascular: Negative.   Gastrointestinal: Positive for nausea, abdominal pain and constipation.  Genitourinary: Negative.   Musculoskeletal: Positive for back pain.  Skin: Negative.   Neurological:  Negative.   Hematological: Negative.   Psychiatric/Behavioral: Negative.    Outpatient Prescriptions Prior to Visit  Medication Sig Dispense Refill  . ALPRAZolam (XANAX) 0.25 MG tablet Take 1 tablet (0.25 mg total) by mouth 3 (three) times daily as needed for anxiety.  90 tablet  5  . amitriptyline (ELAVIL) 25 MG tablet Take 25 mg by mouth at bedtime. [facility]       . methocarbamol (ROBAXIN) 750 MG tablet Take 750 mg by mouth 4 (four) times daily.       Marland Kitchen omeprazole-sodium bicarbonate (ZEGERID) 40-1100 MG per capsule TAKE 1 CAPSULE BY MOUTH TWICE A DAY  60 capsule  4  . oxycodone (ROXICODONE) 30 MG immediate release tablet Take 30 mg by mouth every 4 (four) hours as needed. [facility]            Allergies  Allergen Reactions  . Bupropion Hcl     REACTION: unspecified  . Dilaudid (Hydromorphone Hcl)     Dizzy and burning on the skin  . Morphine And Related     GI upset  . Perindopril Erbumine     REACTION: unspecified   Patient Active Problem List  Diagnoses  . ANXIETY  . CARPAL TUNNEL SYNDROME  . HYPERTENSION  . GERD  . BACK PAIN, THORACIC REGION  . MEDIAL EPICONDYLITIS  . LATERAL EPICONDYLITIS  . DYSESTHESIA  . SKIN RASH  . CHEST PAIN    Objective:   Physical Exam Well-developed white  male in no acute distress, alert and oriented x3, pleasant, HEENT; nontraumatic normocephalic EOMI PERRLA sclera anicteric,Neck; Supple no JVD, Cardiovascular; regular rate and rhythm with S1-S2 no murmur or gallop, Pulmonary ;scattered rhonchi bilaterall,y Abdomen; soft tender in the epigastrium and right upper quadrant there is some voluntary guarding no rebound no mass or palpable hepatosplenomegaly, bowel sounds are active, Rectal; not done, Extremities; no clubbing cyanosis or edema skin warm and dry ,;mood and affect normal an appropriate.        Assessment & Plan:  #61 45 year old male with history of chronic GERD, maintained currently on twice a day Zegerid  with new complaint with a six-week history of intermittent upper abdominal pain and increasing frequency of nausea which occurs generally postprandially. Etiology of symptoms is not clear, doubt peptic ulcer disease with chronic use of twice daily Zegerid no gastric irritants. Will rule out gallbladder disease.  Plan; Continue twice daily Zegerid CBC hepatic panel today Start Zofran 4 mg every 6 hours as needed for nausea Schedule for upper abdominal ultrasound, if this is unrevealing he will need further workup with possible [facility] HIDA scan or CT. Will also review his paper chart for previous workup when it becomes available  #2 Chronic back pain on chronic narcotics

## 2011-05-10 LAB — CBC WITH DIFFERENTIAL/PLATELET
Basophils Absolute: 0.1 10*3/uL (ref 0.0–0.1)
Eosinophils Absolute: 0.2 10*3/uL (ref 0.0–0.7)
HCT: 44.3 % (ref 39.0–52.0)
Hemoglobin: 14.9 g/dL (ref 13.0–17.0)
Lymphs Abs: 3 10*3/uL (ref 0.7–4.0)
MCHC: 33.7 g/dL (ref 30.0–36.0)
Neutro Abs: 2.4 10*3/uL (ref 1.4–7.7)
Platelets: 231 10*3/uL (ref 150.0–400.0)
RDW: 12.8 % (ref 11.5–14.6)

## 2011-05-10 LAB — HEPATIC FUNCTION PANEL
Albumin: 4.3 g/dL (ref 3.5–5.2)
Total Bilirubin: 0.5 mg/dL (ref 0.3–1.2)

## 2011-05-10 LAB — HIGH SENSITIVITY CRP: CRP, High Sensitivity: 1.96 mg/L (ref 0.000–5.000)

## 2011-05-11 ENCOUNTER — Telehealth: Payer: Self-pay | Admitting: *Deleted

## 2011-05-11 ENCOUNTER — Ambulatory Visit (HOSPITAL_COMMUNITY)
Admission: RE | Admit: 2011-05-11 | Discharge: 2011-05-11 | Disposition: A | Discharge status: Home / Self Care | Payer: BC Managed Care – PPO | Source: Ambulatory Visit | Attending: Physician Assistant | Admitting: Physician Assistant

## 2011-05-11 DIAGNOSIS — K219 Gastro-esophageal reflux disease without esophagitis: Secondary | ICD-10-CM

## 2011-05-11 DIAGNOSIS — R110 Nausea: Secondary | ICD-10-CM | POA: Insufficient documentation

## 2011-05-11 DIAGNOSIS — R1013 Epigastric pain: Secondary | ICD-10-CM | POA: Insufficient documentation

## 2011-05-11 NOTE — Telephone Encounter (Signed)
Message copied by Daphine Deutscher on Thu May 11, 2011 11:32 AM ------      Message from: [place], [place] S      Created: Wed May 10, 2011 11:20 AM       Please let Simon know his labs are normal-lymphocye count slightly elevated ?viral  Would repeat in 3 weeks cbc

## 2011-05-11 NOTE — Telephone Encounter (Signed)
Patient given results as per Mike Gip, PA. Labs in [facility] and note to remind patient. Per Mike Gip, PA patient should keep his appointment.

## 2011-05-15 ENCOUNTER — Telehealth: Payer: Self-pay | Admitting: *Deleted

## 2011-05-15 NOTE — Telephone Encounter (Signed)
I called the pt and left a message on his cell number, [phone]. I advised his Ultrasound was normal but Mike Gip PA still thinks he needs to keep his appointment with Dr. Yancey Flemings for tomorrow Tuesday 05-16-2011. I left our number in case he had any questions.

## 2011-05-16 ENCOUNTER — Encounter: Payer: Self-pay | Admitting: Internal Medicine

## 2011-05-16 ENCOUNTER — Ambulatory Visit (INDEPENDENT_AMBULATORY_CARE_PROVIDER_SITE_OTHER): Payer: BC Managed Care – PPO | Admitting: Internal Medicine

## 2011-05-16 DIAGNOSIS — R110 Nausea: Secondary | ICD-10-CM

## 2011-05-16 DIAGNOSIS — R1013 Epigastric pain: Secondary | ICD-10-CM

## 2011-05-16 DIAGNOSIS — K5900 Constipation, unspecified: Secondary | ICD-10-CM

## 2011-05-16 DIAGNOSIS — K219 Gastro-esophageal reflux disease without esophagitis: Secondary | ICD-10-CM

## 2011-05-16 NOTE — Patient Instructions (Addendum)
Your HIDA scan is scheduled at [facility] on 06/01/2011 at 1pm. Nothing to eat or drink 6 hours prior. Stay off your pain medication 6 hours prior to your test Take Miralax as directed  (written instructions given) Call back with an update on how you are doing in 3-4 weeks.

## 2011-05-18 ENCOUNTER — Encounter: Payer: Self-pay | Admitting: Internal Medicine

## 2011-05-18 NOTE — Progress Notes (Signed)
HISTORY OF PRESENT ILLNESS:  Jesse Hanna is a 45 y.o. male with hypertension, anxiety, GERD, chronic back pain with prior back surgery x3, and chronic neck pain. He presents today for followup regarding abdominal pain. The patient has complained of upper abdominal discomfort for 2 months. Intermittent nausea but no vomiting. Symptoms despite PPI. Recently saw physician assistant. Zofran did not help. Ultrasound was unremarkable. Laboratories including CBC and liver function studies were normal. Patient is quite anxious. He underwent extensive workup for abdominal pain 2005. No cause found despite upper endoscopy, colonoscopy with ileal intubation, capsule endoscopy, CT scan, and MRA. He is on chronic pain medication and anxiolytics. No bleeding or weight loss. He does have chronic constipation which has worsened recently. Nausea occurs mostly after meals. He is worried about his gallbladder being the cause of his symptoms. He reports a family member had difficult to diagnose gallbladder disease  REVIEW OF SYSTEMS:  All non-GI ROS negative except for back pain, allergies, anxiety, muscle cramps, night sweats.  Past Medical History  Diagnosis Date  . Hypertension   . Anxiety   . GERD (gastroesophageal reflux disease)   . Thoracic back pain     chronic sees Dr. Riley Kill and Dr. Noel Gerold  . Neck pain, chronic     sees Dr. Riley Kill and Dr. Noel Gerold  . Skin cancer     Past Surgical History  Procedure Date  . Hernia repair     right x 2  . Rotator cuff repair     left and right  . Left foot neuroma   . Coronary angioplasty 2003    per Dr. Elsie Lincoln, normal  . Epidural steroid shots     to thoracic spine  . Spinal fusion     x3 on 12/15/07 on C6-7 and repeat on 05/16/10 then on L5-S1 on 08/23/09 per Dr. Noel Gerold  . Colonoscopy 01 25 2005    normal per Dr. Marina Goodell  . Esophagogastroduodenoscopy 01 05 2005    per Dr. Marina Goodell showed  Genella Rife only    Social History Abhimany[employer] [place]  reports that he  has been smoking Cigarettes.  He has a 7.5 pack-year smoking history. He has never used smokeless tobacco. He reports that he does not drink alcohol or use illicit drugs.  family history includes Cancer in his father; Colon polyps in his mother; Hypertension in an unspecified family member; and Tuberculosis in his father.  Allergies  Allergen Reactions  . Bupropion Hcl     REACTION: unspecified  . Dilaudid (Hydromorphone Hcl)     Dizzy and burning on the skin  . Morphine And Related     GI upset  . Perindopril Erbumine     REACTION: unspecified       PHYSICAL EXAMINATION: Vital signs: BP 112/84  Pulse 100  Ht 6' (1.829 m)  Wt 178 lb (80.74 kg)  BMI 24.14 kg/m2  Constitutional: generally well-appearing, no acute distress Psychiatric: Anxious. alert and oriented x3, cooperative Eyes: extraocular movements intact, anicteric, conjunctiva pink Mouth: oral pharynx moist, no lesions Neck: supple no lymphadenopathy Cardiovascular: heart regular rate and rhythm, no murmur Lungs: clear to auscultation bilaterally Abdomen: soft, nontender, nondistended, no obvious ascites, no peritoneal signs, normal bowel sounds, no organomegaly Extremities: no lower extremity edema bilaterally Skin: no lesions on visible extremities Neuro: No focal deficits.   ASSESSMENT:  #1. Problems with abdominal pain and nausea. This despite PPI. Negative workup to date. Prior extensive workup for abdominal discomfort in 2005 as outlined. Suspect dysmotility  or functional cause. Doubt occult gallbladder disease, but possible #2. Constipation secondary to pain meds #3. GERD   PLAN:  #1. HIDA scan #2. MiraLax. Titrate to achieve one to 2 bowel movements daily #3. Consider gastric emptying scan, EGD, and/or CT if symptoms persist despite improved bowel habits #4. Continue PPI

## 2011-06-01 ENCOUNTER — Other Ambulatory Visit (HOSPITAL_COMMUNITY): Payer: BC Managed Care – PPO

## 2011-06-23 ENCOUNTER — Inpatient Hospital Stay (HOSPITAL_COMMUNITY): Admission: RE | Admit: 2011-06-23 | Payer: BC Managed Care – PPO | Source: Ambulatory Visit

## 2011-07-24 ENCOUNTER — Other Ambulatory Visit: Payer: Self-pay | Admitting: Family Medicine

## 2011-10-05 ENCOUNTER — Other Ambulatory Visit: Payer: Self-pay | Admitting: Family Medicine

## 2011-10-05 NOTE — Telephone Encounter (Signed)
Refill request for Alprazolam 0.25 mg take 1 po tid prn and pt last here on 04/14/11.

## 2011-10-06 ENCOUNTER — Other Ambulatory Visit: Payer: Self-pay

## 2011-10-06 MED ORDER — ALPRAZOLAM 0.25 MG PO TABS: 0.2500 mg | ORAL_TABLET | Freq: Three times a day (TID) | ORAL | Status: DC | PRN

## 2011-10-06 NOTE — Telephone Encounter (Signed)
Call in #[phone] with 5 rf 

## 2011-10-06 NOTE — Telephone Encounter (Signed)
Duplicate request - dr. Clent Ridges already answered Called in

## 2011-10-06 NOTE — Telephone Encounter (Signed)
[facility] 

## 2011-11-01 ENCOUNTER — Ambulatory Visit (INDEPENDENT_AMBULATORY_CARE_PROVIDER_SITE_OTHER): Payer: BC Managed Care – PPO | Admitting: Family Medicine

## 2011-11-01 ENCOUNTER — Encounter: Payer: Self-pay | Admitting: Family Medicine

## 2011-11-01 VITALS — BP 144/90 | HR 102 | Temp 98.1°F | Wt 182.0 lb

## 2011-11-01 DIAGNOSIS — I10 Essential (primary) hypertension: Secondary | ICD-10-CM

## 2011-11-01 DIAGNOSIS — R600 Localized edema: Secondary | ICD-10-CM

## 2011-11-01 DIAGNOSIS — R609 Edema, unspecified: Secondary | ICD-10-CM

## 2011-11-01 DIAGNOSIS — L989 Disorder of the skin and subcutaneous tissue, unspecified: Secondary | ICD-10-CM

## 2011-11-01 MED ORDER — LISINOPRIL 10 MG PO TABS: 10.0000 mg | ORAL_TABLET | Freq: Every day | ORAL | Status: DC

## 2011-11-01 MED ORDER — OMEPRAZOLE-SODIUM BICARBONATE 40-1100 MG PO CAPS: 1.0000 | ORAL_CAPSULE | Freq: Two times a day (BID) | ORAL | Status: DC

## 2011-11-01 MED ORDER — MILNACIPRAN HCL 50 MG PO TABS: 50.0000 mg | ORAL_TABLET | Freq: Every day | ORAL | Status: DC

## 2011-11-01 NOTE — Progress Notes (Signed)
  Subjective:    Patient ID: Jesse Hanna, male    DOB: 08-21-1966, 46 y.o.   MRN: [id number]  HPI Here for 2 days of swelling over the right cheek and for elevated BPs over the past month. He has a hx of HTN, and he took Lisinopril until 2 years ago for this. Then we were able to stop the med. He was put on Savella by his pain management doctors about 2 months ago to help the oxycodone work better.    Review of Systems  Constitutional: Negative.   Respiratory: Negative.   Cardiovascular: Negative.        Objective:   Physical Exam  Constitutional: He appears well-developed and well-nourished.  HENT:       Mild erythema and edema beneath the right eye. The eye is clear  Neck: Neck supple. No thyromegaly present.  Cardiovascular: Normal rate, regular rhythm, normal heart sounds and intact distal pulses.   Pulmonary/Chest: Effort normal and breath sounds normal.  Lymphadenopathy:    He has no cervical adenopathy.          Assessment & Plan:  It is not clear what the etiology of the cheek swelling might be. It may be allergic in nature, and it does not seem to be infectious. Use ice packs and Benadryl. Start back on Lisinopril for the HTN.

## 2011-11-06 ENCOUNTER — Telehealth: Payer: Self-pay | Admitting: Family Medicine

## 2011-11-06 NOTE — Telephone Encounter (Signed)
Pt called back to check on status of getting med called in for chest congestion. Pt req that med be called in to [facility] [address]. Pt didn't want to sch ov because pt was in to see pcp last. Pls call pt at home # [phone] when med has been called in.

## 2011-11-06 NOTE — Telephone Encounter (Signed)
Pt has chest congestion,low grade fever over weekend,chest tightness ?bronchitis. Pt would like something call [facility] [address]. Pt decline ov.

## 2011-11-07 ENCOUNTER — Encounter: Payer: Self-pay | Admitting: Family Medicine

## 2011-11-07 ENCOUNTER — Ambulatory Visit (INDEPENDENT_AMBULATORY_CARE_PROVIDER_SITE_OTHER): Payer: BC Managed Care – PPO | Admitting: Family Medicine

## 2011-11-07 VITALS — BP 116/82 | HR 112 | Temp 98.7°F | Wt 182.0 lb

## 2011-11-07 DIAGNOSIS — J329 Chronic sinusitis, unspecified: Secondary | ICD-10-CM

## 2011-11-07 MED ORDER — AMOXICILLIN-POT CLAVULANATE 875-125 MG PO TABS: 1.0000 | ORAL_TABLET | Freq: Two times a day (BID) | ORAL | Status: DC

## 2011-11-07 NOTE — Telephone Encounter (Signed)
He is here for an OV today  

## 2011-11-07 NOTE — Progress Notes (Signed)
  Subjective:    Patient ID: Jesse Hanna, male    DOB: 21-Feb-1966, 46 y.o.   MRN: [id number]  HPI Here for one week of sinus pressure, PND, chest congestion, and coughing up green sputum. Taking Mucinex and Advil for fever.    Review of Systems  Constitutional: Positive for fever.  HENT: Positive for congestion, postnasal drip and sinus pressure.   Eyes: Negative.   Respiratory: Positive for cough.        Objective:   Physical Exam  Constitutional: He appears well-developed and well-nourished.  HENT:  Right Ear: External ear normal.  Left Ear: External ear normal.  Nose: Nose normal.  Mouth/Throat: Oropharynx is clear and moist. No oropharyngeal exudate.  Eyes: Conjunctivae are normal.  Pulmonary/Chest: Effort normal. He has wheezes. He has no rales.  Lymphadenopathy:    He has no cervical adenopathy.          Assessment & Plan:  Recheck prn

## 2012-01-19 ENCOUNTER — Other Ambulatory Visit: Payer: Self-pay | Admitting: Orthopaedic Surgery

## 2012-01-31 NOTE — H&P (Signed)
Jesse Hanna is an 46 y.o. male.   Chief Complaint: left hand numbness and tingling HPI: this patient has had left hand and wrist numbness and tingling for an extended period of time multiple months.  He uses both arms driving a forklift and this really seems to exacerbate things and he also has trouble sleeping at nighttime been comfortable.  He has tried bracing and anti-inflammatory medicines all of which were minimal help.  The nerve conduction study 2 years ago showed carpal tunnel syndrome area  Past Medical History  Diagnosis Date  . Hypertension   . Anxiety   . GERD (gastroesophageal reflux disease)   . Thoracic back pain     [facility] at [facility]   . Neck pain, chronic     sees [facility] at [facility]   . Skin cancer     Past Surgical History  Procedure Date  . Hernia repair     right x 2  . Rotator cuff repair     left and right  . Left foot neuroma   . Coronary angioplasty 2003    per Jesse Hanna, normal  . Epidural steroid shots     to thoracic spine  . Spinal fusion     x3 on 12/15/07 on C6-7 and repeat on 05/16/10 then on L5-S1 on 08/23/09 per Jesse Hanna  . Colonoscopy 01 25 2005    normal per Jesse Hanna  . Esophagogastroduodenoscopy 01 05 2005    per Jesse Hanna showed  Jesse Hanna only    Family History  Problem Relation Age of Onset  . Hypertension    . Cancer Father     oral  . Colon polyps Mother   . Tuberculosis Father    Social History:  reports that he has been smoking Cigarettes.  He has a 7.5 pack-year smoking history. He has never used smokeless tobacco. He reports that he does not drink alcohol or use illicit drugs.  Allergies:  Allergies  Allergen Reactions  . Bupropion Hcl     REACTION: unspecified  . Dilaudid (Hydromorphone Hcl)     Dizzy and burning on the skin  . Morphine And Related     GI upset  . Perindopril Erbumine     REACTION: unspecified    No prescriptions prior to  admission    No results found for this or any previous visit (from the past 48 hour(s)). No results found.  Review of Systems  Constitutional: Negative.   HENT: Negative.   Eyes: Negative.   Respiratory: Negative.   Cardiovascular: Negative.   Gastrointestinal: Negative.   Genitourinary: Negative.   Musculoskeletal: Negative.   Skin: Negative.   Neurological: Negative.   Endo/Heme/Allergies: Negative.   Psychiatric/Behavioral: Negative.     There were no vitals taken for this visit. Physical Exam  Constitutional: He is oriented to person, place, and time. He appears well-nourished.  HENT:  Head: Atraumatic.  Eyes: Pupils are equal, round, and reactive to light.  Neck: Normal range of motion.  Cardiovascular: Regular rhythm.   Respiratory: Breath sounds normal.  GI: Soft.  Musculoskeletal:       Left wrist exam: Full motion.  He does have mild atrophy of the thenar eminence.  He does feel 2 point discrimination at 10 mm.  I'll grip weakness.  Positive Tinel's over the carpal tunnel.  Neurological: He is oriented to person, place, and time.  Skin: Skin is dry.  Psychiatric: He has a  normal mood and affect.     Assessment/Plan Assessment:  Left carpal tunnel syndrome Plan: Due to the longevity of his symptoms we have discussed proceeding with a left carpal tunnel release.  Have reviewed the risks of anesthesia, infection and neurovascular injury associated with carpal tunnel release.  Jesse Hanna R 01/31/2012, 12:20 PM

## 2012-02-01 ENCOUNTER — Encounter (HOSPITAL_BASED_OUTPATIENT_CLINIC_OR_DEPARTMENT_OTHER): Payer: Self-pay | Admitting: *Deleted

## 2012-02-01 NOTE — Progress Notes (Signed)
Pt has had many surg-chronic pain-to come in for bmet-ekg Has had shoulder surg here-[facility]

## 2012-02-02 ENCOUNTER — Encounter (HOSPITAL_BASED_OUTPATIENT_CLINIC_OR_DEPARTMENT_OTHER)
Admission: RE | Admit: 2012-02-02 | Discharge: 2012-02-02 | Disposition: A | Discharge status: Home / Self Care | Payer: BC Managed Care – PPO | Source: Ambulatory Visit | Attending: Orthopaedic Surgery | Admitting: Orthopaedic Surgery

## 2012-02-02 LAB — BASIC METABOLIC PANEL
Calcium: 9.4 mg/dL (ref 8.4–10.5)
Creatinine, Ser: 0.79 mg/dL (ref 0.50–1.35)
GFR calc non Af Amer: >90 mL/min (ref >90)
Sodium: 135 mEq/L (ref 135–145)

## 2012-02-06 ENCOUNTER — Encounter (HOSPITAL_BASED_OUTPATIENT_CLINIC_OR_DEPARTMENT_OTHER): Payer: Self-pay | Admitting: *Deleted

## 2012-02-06 ENCOUNTER — Ambulatory Visit (HOSPITAL_BASED_OUTPATIENT_CLINIC_OR_DEPARTMENT_OTHER)
Admission: RE | Admit: 2012-02-06 | Discharge: 2012-02-06 | Disposition: A | Discharge status: Home / Self Care | Payer: BC Managed Care – PPO | Source: Ambulatory Visit | Attending: Orthopaedic Surgery | Admitting: Orthopaedic Surgery

## 2012-02-06 ENCOUNTER — Encounter (HOSPITAL_BASED_OUTPATIENT_CLINIC_OR_DEPARTMENT_OTHER)
Admission: RE | Disposition: A | Discharge status: Home / Self Care | Payer: Self-pay | Source: Ambulatory Visit | Attending: Orthopaedic Surgery

## 2012-02-06 ENCOUNTER — Ambulatory Visit (HOSPITAL_BASED_OUTPATIENT_CLINIC_OR_DEPARTMENT_OTHER): Payer: BC Managed Care – PPO | Admitting: *Deleted

## 2012-02-06 ENCOUNTER — Encounter (HOSPITAL_BASED_OUTPATIENT_CLINIC_OR_DEPARTMENT_OTHER): Payer: Self-pay | Admitting: Anesthesiology

## 2012-02-06 DIAGNOSIS — K219 Gastro-esophageal reflux disease without esophagitis: Secondary | ICD-10-CM | POA: Insufficient documentation

## 2012-02-06 DIAGNOSIS — G5600 Carpal tunnel syndrome, unspecified upper limb: Secondary | ICD-10-CM | POA: Insufficient documentation

## 2012-02-06 DIAGNOSIS — I10 Essential (primary) hypertension: Secondary | ICD-10-CM | POA: Insufficient documentation

## 2012-02-06 DIAGNOSIS — M546 Pain in thoracic spine: Secondary | ICD-10-CM | POA: Insufficient documentation

## 2012-02-06 DIAGNOSIS — M542 Cervicalgia: Secondary | ICD-10-CM | POA: Insufficient documentation

## 2012-02-06 DIAGNOSIS — Z01810 Encounter for preprocedural cardiovascular examination: Secondary | ICD-10-CM | POA: Insufficient documentation

## 2012-02-06 HISTORY — PX: CARPAL TUNNEL RELEASE: SHX101

## 2012-02-06 SURGERY — CARPAL TUNNEL RELEASE
Anesthesia: General | Site: Hand | Laterality: Left | Wound class: Clean

## 2012-02-06 MED ORDER — FENTANYL CITRATE 0.05 MG/ML IJ SOLN
INTRAMUSCULAR | Status: DC | PRN
  Administered 2012-02-06: 100 ug via INTRAVENOUS

## 2012-02-06 MED ORDER — BUPIVACAINE HCL (PF) 0.25 % IJ SOLN
INTRAMUSCULAR | Status: DC | PRN
  Administered 2012-02-06: 4 mL

## 2012-02-06 MED ORDER — HYDROMORPHONE HCL 2 MG PO TABS: 2.0000 mg | ORAL_TABLET | ORAL | Status: AC | PRN

## 2012-02-06 MED ORDER — FENTANYL CITRATE 0.05 MG/ML IJ SOLN: 25.0000 ug | INTRAMUSCULAR | Status: DC | PRN

## 2012-02-06 MED ORDER — MIDAZOLAM HCL 5 MG/5ML IJ SOLN
INTRAMUSCULAR | Status: DC | PRN
  Administered 2012-02-06: 2 mg via INTRAVENOUS

## 2012-02-06 MED ORDER — OXYCODONE HCL 5 MG PO TABS
5.0000 mg | ORAL_TABLET | Freq: Once | ORAL | Status: AC | PRN
  Administered 2012-02-06: 5 mg via ORAL

## 2012-02-06 MED ORDER — LIDOCAINE HCL (CARDIAC) 20 MG/ML IV SOLN
INTRAVENOUS | Status: DC | PRN
  Administered 2012-02-06: 100 mg via INTRAVENOUS

## 2012-02-06 MED ORDER — METOCLOPRAMIDE HCL 5 MG/ML IJ SOLN: 10.0000 mg | Freq: Once | INTRAMUSCULAR | Status: DC | PRN

## 2012-02-06 MED ORDER — LACTATED RINGERS IV SOLN
INTRAVENOUS | Status: DC
  Administered 2012-02-06: 10:00:00 via INTRAVENOUS

## 2012-02-06 MED ORDER — 0.9 % SODIUM CHLORIDE (POUR BTL) OPTIME
TOPICAL | Status: DC | PRN
  Administered 2012-02-06: 1000 mL

## 2012-02-06 MED ORDER — CHLORHEXIDINE GLUCONATE 4 % EX LIQD: 60.0000 mL | Freq: Once | CUTANEOUS | Status: DC

## 2012-02-06 MED ORDER — CEFAZOLIN SODIUM-DEXTROSE 2-3 GM-% IV SOLR
2.0000 g | INTRAVENOUS | Status: AC
  Administered 2012-02-06: 2 g via INTRAVENOUS

## 2012-02-06 MED ORDER — METOCLOPRAMIDE HCL 5 MG/ML IJ SOLN
INTRAMUSCULAR | Status: DC | PRN
  Administered 2012-02-06: 10 mg via INTRAVENOUS

## 2012-02-06 MED ORDER — ONDANSETRON HCL 4 MG/2ML IJ SOLN
INTRAMUSCULAR | Status: DC | PRN
  Administered 2012-02-06: 4 mg via INTRAVENOUS

## 2012-02-06 MED ORDER — DEXAMETHASONE SODIUM PHOSPHATE 10 MG/ML IJ SOLN
INTRAMUSCULAR | Status: DC | PRN
  Administered 2012-02-06: 10 mg via INTRAVENOUS

## 2012-02-06 MED ORDER — PROPOFOL 10 MG/ML IV EMUL
INTRAVENOUS | Status: DC | PRN
  Administered 2012-02-06: 300 mg via INTRAVENOUS

## 2012-02-06 SURGICAL SUPPLY — 51 items
BANDAGE ELASTIC 4 VELCRO ST LF (GAUZE/BANDAGES/DRESSINGS) IMPLANT
BANDAGE GAUZE ELAST BULKY 4 IN (GAUZE/BANDAGES/DRESSINGS) ×2 IMPLANT
BLADE MINI RND TIP GREEN BEAV (BLADE) ×2 IMPLANT
BLADE SURG 15 STRL LF DISP TIS (BLADE) ×1 IMPLANT
BLADE SURG 15 STRL SS (BLADE) ×2
BNDG CMPR 9X4 STRL LF SNTH (GAUZE/BANDAGES/DRESSINGS) ×1
BNDG ESMARK 4X9 LF (GAUZE/BANDAGES/DRESSINGS) ×2 IMPLANT
COVER MAYO STAND STRL (DRAPES) ×2 IMPLANT
COVER TABLE BACK 60X90 (DRAPES) ×2 IMPLANT
CUFF TOURNIQUET SINGLE 18IN (TOURNIQUET CUFF) ×2 IMPLANT
DRAPE EXTREMITY T 121X128X90 (DRAPE) ×2 IMPLANT
DRAPE SURG 17X23 STRL (DRAPES) ×2 IMPLANT
DRSG EMULSION OIL 3X3 NADH (GAUZE/BANDAGES/DRESSINGS) ×2 IMPLANT
DRSG PAD ABDOMINAL 8X10 ST (GAUZE/BANDAGES/DRESSINGS) ×2 IMPLANT
DURAPREP 26ML APPLICATOR (WOUND CARE) ×2 IMPLANT
ELECT NEEDLE TIP 2.8 STRL (NEEDLE) ×2 IMPLANT
ELECT REM PT RETURN 9FT ADLT (ELECTROSURGICAL) ×2
ELECTRODE REM PT RTRN 9FT ADLT (ELECTROSURGICAL) ×1 IMPLANT
GLOVE BIO SURGEON STRL SZ 6.5 (GLOVE) ×2 IMPLANT
GLOVE BIO SURGEON STRL SZ8.5 (GLOVE) ×2 IMPLANT
GLOVE BIOGEL PI IND STRL 7.0 (GLOVE) ×1 IMPLANT
GLOVE BIOGEL PI IND STRL 8 (GLOVE) ×1 IMPLANT
GLOVE BIOGEL PI IND STRL 8.5 (GLOVE) ×1 IMPLANT
GLOVE BIOGEL PI INDICATOR 7.0 (GLOVE) ×1
GLOVE BIOGEL PI INDICATOR 8 (GLOVE) ×1
GLOVE BIOGEL PI INDICATOR 8.5 (GLOVE) ×1
GLOVE SS BIOGEL STRL SZ 8 (GLOVE) ×1 IMPLANT
GLOVE SUPERSENSE BIOGEL SZ 8 (GLOVE) ×1
GOWN PREVENTION PLUS XLARGE (GOWN DISPOSABLE) ×2 IMPLANT
LOOP VESSEL MAXI BLUE (MISCELLANEOUS) IMPLANT
NEEDLE HYPO 25X1 1.5 SAFETY (NEEDLE) ×2 IMPLANT
NS IRRIG 1000ML POUR BTL (IV SOLUTION) ×2 IMPLANT
PACK BASIN DAY SURGERY FS (CUSTOM PROCEDURE TRAY) ×2 IMPLANT
PADDING CAST ABS 3INX4YD NS (CAST SUPPLIES)
PADDING CAST ABS 4INX4YD NS (CAST SUPPLIES)
PADDING CAST ABS COTTON 3X4 (CAST SUPPLIES) IMPLANT
PADDING CAST ABS COTTON 4X4 ST (CAST SUPPLIES) IMPLANT
PENCIL BUTTON HOLSTER BLD 10FT (ELECTRODE) ×2 IMPLANT
SPLINT PLASTER CAST XFAST 3X15 (CAST SUPPLIES) ×5 IMPLANT
SPLINT PLASTER XTRA FASTSET 3X (CAST SUPPLIES) ×5
SPONGE GAUZE 4X4 12PLY (GAUZE/BANDAGES/DRESSINGS) ×2 IMPLANT
STOCKINETTE 4X48 STRL (DRAPES) ×2 IMPLANT
SUT ETHILON 4 0 PS 2 18 (SUTURE) ×2 IMPLANT
SUT VIC AB 4-0 P-3 18XBRD (SUTURE) IMPLANT
SUT VIC AB 4-0 P3 18 (SUTURE)
SYR BULB 3OZ (MISCELLANEOUS) ×2 IMPLANT
SYR CONTROL 10ML LL (SYRINGE) ×2 IMPLANT
TOWEL OR 17X24 6PK STRL BLUE (TOWEL DISPOSABLE) ×2 IMPLANT
TOWEL OR NON WOVEN STRL DISP B (DISPOSABLE) ×2 IMPLANT
UNDERPAD 30X30 INCONTINENT (UNDERPADS AND DIAPERS) ×2 IMPLANT
WATER STERILE IRR 1000ML POUR (IV SOLUTION) IMPLANT

## 2012-02-06 NOTE — Anesthesia Preprocedure Evaluation (Addendum)
Anesthesia Evaluation  Patient identified by MRN, date of birth, ID band Patient awake    Reviewed: Allergy & Precautions, H&P , NPO status , Patient's Chart, lab work & pertinent test results, reviewed documented beta blocker date and time   Airway Mallampati: II TM Distance: >3 FB Neck ROM: full    Dental   Pulmonary neg pulmonary ROS,          Cardiovascular hypertension,     Neuro/Psych  Neuromuscular disease negative neurological ROS  negative psych ROS   GI/Hepatic Neg liver ROS, GERD-  Medicated and Controlled,  Endo/Other  negative endocrine ROS  Renal/GU negative Renal ROS  negative genitourinary   Musculoskeletal   Abdominal   Peds  Hematology negative hematology ROS (+)   Anesthesia Other Findings See surgeon's H&P   Reproductive/Obstetrics negative OB ROS                           Anesthesia Physical Anesthesia Plan  ASA: II  Anesthesia Plan: General   Post-op Pain Management:    Induction: Intravenous  Airway Management Planned: LMA  Additional Equipment:   Intra-op Plan:   Post-operative Plan: Extubation in OR  Informed Consent: I have reviewed the patients History and Physical, chart, labs and discussed the procedure including the risks, benefits and alternatives for the proposed anesthesia with the patient or authorized representative who has indicated his/her understanding and acceptance.   Dental Advisory Given  Plan Discussed with: CRNA and Surgeon  Anesthesia Plan Comments:         Anesthesia Quick Evaluation

## 2012-02-06 NOTE — Anesthesia Procedure Notes (Signed)
Procedure Name: LMA Insertion Date/Time: 02/06/2012 11:02 AM Performed by: Burna Cash Pre-anesthesia Checklist: Patient identified, Emergency Drugs available, Suction available and Patient being monitored Patient Re-evaluated:Patient Re-evaluated prior to inductionOxygen Delivery Method: Circle System Utilized Preoxygenation: Pre-oxygenation with 100% oxygen Intubation Type: IV induction Ventilation: Mask ventilation without difficulty LMA: LMA inserted LMA Size: 5.0 Number of attempts: 1 Airway Equipment and Method: bite block Placement Confirmation: positive ETCO2 Tube secured with: Tape Dental Injury: Teeth and Oropharynx as per pre-operative assessment

## 2012-02-06 NOTE — Transfer of Care (Signed)
Immediate Anesthesia Transfer of Care Note  Patient: Jesse Hanna  Procedure(s) Performed: Procedure(s) (LRB): CARPAL TUNNEL RELEASE (Left)  Patient Location: PACU  Anesthesia Type: General  Level of Consciousness: sedated  Airway & Oxygen Therapy: Patient Spontanous Breathing and Patient connected to face mask oxygen  Post-op Assessment: Report given to PACU RN and Post -op Vital signs reviewed and stable  Post vital signs: Reviewed and stable  Complications: No apparent anesthesia complications

## 2012-02-06 NOTE — Discharge Instructions (Signed)
Ice and elevate Leave dressing on till office visit in 10-12 days    Post Anesthesia Home Care Instructions  Activity: Get plenty of rest for the remainder of the day. A responsible adult should stay with you for 24 hours following the procedure.  For the next 24 hours, DO NOT: -Drive a car -Advertising copywriter -Drink alcoholic beverages -Take any medication unless instructed by your physician -Make any legal decisions or sign important papers.  Meals: Start with liquid foods such as gelatin or soup. Progress to regular foods as tolerated. Avoid greasy, spicy, heavy foods. If nausea and/or vomiting occur, drink only clear liquids until the nausea and/or vomiting subsides. Call your physician if vomiting continues.  Special Instructions/Symptoms: Your throat may feel dry or sore from the anesthesia or the breathing tube placed in your throat during surgery. If this causes discomfort, gargle with warm salt water. The discomfort should disappear within 24 hours.

## 2012-02-06 NOTE — Interval H&P Note (Signed)
History and Physical Interval Note:  02/06/2012 10:46 AM  Jesse Hanna  has presented today for surgery, with the diagnosis of left cts  The various methods of treatment have been discussed with the patient and family. After consideration of risks, benefits and other options for treatment, the patient has consented to  Procedure(s) (LRB): CARPAL TUNNEL RELEASE (Left) as a surgical intervention .  The patients' history has been reviewed, patient examined, no change in status, stable for surgery.  I have reviewed the patients' chart and labs.  Questions were answered to the patient's satisfaction.     Daivd Fredericksen G

## 2012-02-06 NOTE — Op Note (Signed)
PRE-OP DIAGNOSIS:  left carpal tunnel syndrome POST-OP DIAGNOSIS:  left carpal tunnel syndrome PROCEDURE:  left carpal tunnel release ANESTHESIA:  general ATTENDING SURGEON:  Marcene Corning MD ASSISTANT:  Lindwood Qua PA  INDICATION FOR PROCEDURE:  Seymore Brodowski is a 46 y.o. male with a long history of hand pain and numbness.  The patient has failed conservative measures of treatment and persists with symptoms which are very limiting.  The patient is offered carpal tunnel release in hopes of improving their situation by relieving pressure on the medial nerve.  Informed operative consent was obtained after discussion of possible complications including reaction to anesthesia, infection, and neurovascular injury.  SUMMARY OF FINDINGS AND PROCEDURE:  Under to above anesthetic a carpal tunnel release was performed.  The patient had a mildly thickened transverse carpal tunnel ligament which was applying compression to the underlying medial nerve.  This was release under direct visualization.  The skin was closed primarily followed by placement of a sterile dressing and splint with the wrist in slight extension.  DESCRIPTION OF PROCEDURE:  Wayden Schwertner was taken to an operative suite where the above anesthetic was applied.  The patient was then prepped and draped in normal sterile fashion.  An appropriate time out was performed.  After the administration of kefzol pre-operative antibiotic and application and inflation of a tourniquet a small incision was made on the palmar aspect of the palm.  This did not cross the wrist flexion crease and was ulnar to the thenar flexion crease.  Dissection was carried down through palmar fascia to expose the transverse carpal ligament which was released under direct visualization.  The dissection was taken distally towards the transverse arch of vessels and distally through the distal fascia of the forearm.  The wound was irrigated and the skin was closed  with nylon.  The tourniquet was deflated and skin edges became pink immediately and pulses were intact.  Adaptic was applied along with a sterile dressing and a plaster splint with the wrist in slight extension.  Estimated blood loss and intraoperative fluids can be obtained from anesthesia records as can tourniquet time.  DISPOSITION:  Zailen Albarran was taken to recovery room in stable condition.  Plans were to go home same day and I will contact the patient by phone tonight.  The patient will follow-up in about a week in the office.  Antaeus Karel G 02/06/2012, 11:25 AM

## 2012-02-06 NOTE — Anesthesia Postprocedure Evaluation (Signed)
Anesthesia Post Note  Patient: Jesse Hanna  Procedure(s) Performed: Procedure(s) (LRB): CARPAL TUNNEL RELEASE (Left)  Anesthesia type: General  Patient location: PACU  Post pain: Pain level controlled  Post assessment: Patient's Cardiovascular Status Stable  Last Vitals:  Filed Vitals:   02/06/12 1245  BP: 112/70  Pulse: 86  Temp: 36.8 C  Resp: 16    Post vital signs: Reviewed and stable  Level of consciousness: alert  Complications: No apparent anesthesia complications

## 2012-02-07 ENCOUNTER — Encounter (HOSPITAL_BASED_OUTPATIENT_CLINIC_OR_DEPARTMENT_OTHER): Payer: Self-pay | Admitting: Orthopaedic Surgery

## 2012-03-06 ENCOUNTER — Encounter: Payer: Self-pay | Admitting: Family Medicine

## 2012-03-06 ENCOUNTER — Ambulatory Visit (INDEPENDENT_AMBULATORY_CARE_PROVIDER_SITE_OTHER): Payer: BC Managed Care – PPO | Admitting: Family Medicine

## 2012-03-06 VITALS — BP 124/86 | HR 90 | Temp 98.7°F | Wt 170.0 lb

## 2012-03-06 DIAGNOSIS — J329 Chronic sinusitis, unspecified: Secondary | ICD-10-CM

## 2012-03-06 DIAGNOSIS — F411 Generalized anxiety disorder: Secondary | ICD-10-CM

## 2012-03-06 DIAGNOSIS — F419 Anxiety disorder, unspecified: Secondary | ICD-10-CM

## 2012-03-06 MED ORDER — AMOXICILLIN-POT CLAVULANATE 875-125 MG PO TABS: 1.0000 | ORAL_TABLET | Freq: Two times a day (BID) | ORAL | Status: AC

## 2012-03-06 MED ORDER — ALPRAZOLAM 0.25 MG PO TABS: 0.2500 mg | ORAL_TABLET | Freq: Three times a day (TID) | ORAL | Status: DC | PRN

## 2012-03-06 NOTE — Progress Notes (Signed)
  Subjective:    Patient ID: Kizzie Bane, male    DOB: Apr 09, 1966, 46 y.o.   MRN: [id number]  HPI Here for one week of sinus pressure, PND, ST, and coughing up yellow sputum. No fever.    Review of Systems  Constitutional: Negative.   HENT: Positive for congestion, postnasal drip and sinus pressure.   Eyes: Negative.   Respiratory: Positive for cough.        Objective:   Physical Exam  Constitutional: He appears well-developed and well-nourished.  HENT:  Right Ear: External ear normal.  Left Ear: External ear normal.  Nose: Nose normal.  Mouth/Throat: Oropharynx is clear and moist. No oropharyngeal exudate.  Eyes: Conjunctivae are normal.  Pulmonary/Chest: Effort normal and breath sounds normal.  Lymphadenopathy:    He has no cervical adenopathy.          Assessment & Plan:  Add Mucinex.

## 2012-09-02 ENCOUNTER — Other Ambulatory Visit: Payer: Self-pay | Admitting: Family Medicine

## 2012-09-04 NOTE — Telephone Encounter (Signed)
Pt needs refill of ALPRAZolam (XANAX) 0.25 MG tablet. Pt has been out for 3 days. [facility] [address]

## 2012-09-05 NOTE — Telephone Encounter (Signed)
Call in #[phone] with 5 rf 

## 2012-11-25 ENCOUNTER — Other Ambulatory Visit: Payer: Self-pay | Admitting: Family Medicine

## 2013-01-21 ENCOUNTER — Telehealth: Payer: Self-pay | Admitting: Family Medicine

## 2013-01-21 NOTE — Telephone Encounter (Signed)
Refill for one year 

## 2013-01-21 NOTE — Telephone Encounter (Signed)
Refill request for Lisinopril 10 mg take 1 po qd, can we refill this?

## 2013-01-22 MED ORDER — LISINOPRIL 10 MG PO TABS: 10.0000 mg | ORAL_TABLET | Freq: Every day | ORAL | Status: DC

## 2013-01-22 NOTE — Telephone Encounter (Signed)
I sent script e-scribe. 

## 2013-02-03 ENCOUNTER — Other Ambulatory Visit: Payer: Self-pay | Admitting: Family Medicine

## 2013-02-06 ENCOUNTER — Other Ambulatory Visit: Payer: Self-pay | Admitting: Family Medicine

## 2013-02-20 ENCOUNTER — Other Ambulatory Visit: Payer: Self-pay | Admitting: Family Medicine

## 2013-02-20 NOTE — Telephone Encounter (Signed)
Call in #[phone] with 5 rf 

## 2013-06-23 ENCOUNTER — Other Ambulatory Visit: Payer: Self-pay | Admitting: Family Medicine

## 2013-07-07 ENCOUNTER — Encounter: Payer: Self-pay | Admitting: Family Medicine

## 2013-07-07 ENCOUNTER — Ambulatory Visit (INDEPENDENT_AMBULATORY_CARE_PROVIDER_SITE_OTHER): Payer: BC Managed Care – PPO | Admitting: Family Medicine

## 2013-07-07 VITALS — BP 126/84 | HR 98 | Temp 98.6°F | Ht 70.25 in | Wt 189.0 lb

## 2013-07-07 DIAGNOSIS — F411 Generalized anxiety disorder: Secondary | ICD-10-CM

## 2013-07-07 DIAGNOSIS — I10 Essential (primary) hypertension: Secondary | ICD-10-CM

## 2013-07-07 DIAGNOSIS — K219 Gastro-esophageal reflux disease without esophagitis: Secondary | ICD-10-CM

## 2013-07-07 DIAGNOSIS — Z23 Encounter for immunization: Secondary | ICD-10-CM

## 2013-07-07 LAB — BASIC METABOLIC PANEL
CO2: 29 mEq/L (ref 19–32)
Chloride: 101 mEq/L (ref 96–112)
Glucose, Bld: 97 mg/dL (ref 70–99)
Potassium: 4.1 mEq/L (ref 3.5–5.1)
Sodium: 139 mEq/L (ref 135–145)

## 2013-07-07 LAB — HEPATIC FUNCTION PANEL
AST: 22 U/L (ref 0–37)
Albumin: 4.1 g/dL (ref 3.5–5.2)
Alkaline Phosphatase: 51 U/L (ref 39–117)
Total Protein: 7.2 g/dL (ref 6.0–8.3)

## 2013-07-07 LAB — CBC WITH DIFFERENTIAL/PLATELET
Basophils Relative: 0.6 % (ref 0.0–3.0)
Eosinophils Absolute: 0.2 10*3/uL (ref 0.0–0.7)
Eosinophils Relative: 3.1 % (ref 0.0–5.0)
MCHC: 34.3 g/dL (ref 30.0–36.0)
MCV: 89.3 fl (ref 78.0–100.0)
Monocytes Absolute: 0.6 10*3/uL (ref 0.1–1.0)
Neutrophils Relative %: 50.6 % (ref 43.0–77.0)
RBC: 5 Mil/uL (ref 4.22–5.81)

## 2013-07-07 LAB — POCT URINALYSIS DIPSTICK
Bilirubin, UA: NEGATIVE
Blood, UA: NEGATIVE
Nitrite, UA: NEGATIVE
Spec Grav, UA: 1.01
Urobilinogen, UA: 0.2
pH, UA: 7

## 2013-07-07 MED ORDER — ALPRAZOLAM 0.5 MG PO TABS: 0.5000 mg | ORAL_TABLET | Freq: Three times a day (TID) | ORAL | Status: DC | PRN

## 2013-07-07 MED ORDER — OMEPRAZOLE-SODIUM BICARBONATE 40-1100 MG PO CAPS: ORAL_CAPSULE | ORAL | Status: DC

## 2013-07-07 NOTE — Progress Notes (Signed)
  Subjective:    Patient ID: Jesse Hanna, male    DOB: 26-Nov-1965, 47 y.o.   MRN: [id number]  HPI Here for labs and refills. His anxiety has been more of a problem lately and he asks if we can increase the dose of his Xanax. He is being evaluated for more neck pain lately.    Review of Systems  Constitutional: Negative.   Respiratory: Negative.   Cardiovascular: Negative.   Psychiatric/Behavioral: Negative for dysphoric mood and agitation. The patient is nervous/anxious.        Objective:   Physical Exam  Constitutional: He appears well-developed and well-nourished.  Cardiovascular: Normal rate, regular rhythm, normal heart sounds and intact distal pulses.   Pulmonary/Chest: Effort normal and breath sounds normal.  Psychiatric: He has a normal mood and affect. His behavior is normal. Thought content normal.          Assessment & Plan:  Increase Xanax to 0.5 mg tid. Get labs

## 2013-07-11 ENCOUNTER — Telehealth: Payer: Self-pay | Admitting: Family Medicine

## 2013-07-11 ENCOUNTER — Ambulatory Visit (INDEPENDENT_AMBULATORY_CARE_PROVIDER_SITE_OTHER): Payer: BC Managed Care – PPO | Admitting: Family Medicine

## 2013-07-11 ENCOUNTER — Encounter: Payer: Self-pay | Admitting: Family Medicine

## 2013-07-11 VITALS — BP 130/84 | HR 108 | Temp 98.5°F | Wt 189.0 lb

## 2013-07-11 DIAGNOSIS — J019 Acute sinusitis, unspecified: Secondary | ICD-10-CM

## 2013-07-11 MED ORDER — AMOXICILLIN-POT CLAVULANATE 875-125 MG PO TABS: 1.0000 | ORAL_TABLET | Freq: Two times a day (BID) | ORAL | Status: DC

## 2013-07-11 NOTE — Progress Notes (Signed)
Pre visit review using our clinic review tool, if applicable. No additional management support is needed unless otherwise documented below in the visit note. 

## 2013-07-11 NOTE — Telephone Encounter (Signed)
Patient Information:  Caller Name: [place]  Phone: [phone]  Patient: Jesse, Hanna  Gender: Male  DOB: 1966-04-07  Age: 47 Years  PCP: Gershon Crane [facility])  Office Follow Up:  Does the office need to follow up with this patient?: No  Instructions For The Office: N/A  RN Note:  Offered earlier appointment, but patient refused. He would rather wait to see his PCP.  Symptoms  Reason For Call & Symptoms: Right ear pain, sinus pain/pressure. Feels like there is fluid in the ear canal. Gets dizzy at times when he turns his head. Reports skin over sinus areas is hot to the touch.  Reviewed Health History In EMR: Yes  Reviewed Medications In EMR: Yes  Reviewed Allergies In EMR: Yes  Reviewed Surgeries / Procedures: Yes  Date of Onset of Symptoms: 07/09/2013  Guideline(s) Used:  Sinus Pain and Congestion  Disposition Per Guideline:   Go to Office Now  Reason For Disposition Reached:   Redness or swelling on the cheek, forehead, or around the eye  Advice Given:  N/A  Patient Will Follow Care Advice:  YES  Appointment Scheduled:  07/11/2013 14:30:00 Appointment Scheduled Provider:  Gershon Crane [facility])

## 2013-07-11 NOTE — Progress Notes (Signed)
  Subjective:    Patient ID: Jesse Hanna, male    DOB: [birth year], 47 y.o.   MRN: [id number]  HPI Here for 3 days of right ear pain and sinus pressure. No ST or cough. No fever.    Review of Systems  HENT: Positive for congestion, postnasal drip and sinus pressure.   Eyes: Negative.   Respiratory: Negative.        Objective:   Physical Exam  Constitutional: He appears well-developed and well-nourished.  HENT:  Right Ear: External ear normal.  Left Ear: External ear normal.  Nose: Nose normal.  Mouth/Throat: Oropharynx is clear and moist.  Eyes: Conjunctivae are normal.  Pulmonary/Chest: Effort normal and breath sounds normal.  Lymphadenopathy:    He has no cervical adenopathy.          Assessment & Plan:  Add Mucinex

## 2013-09-18 ENCOUNTER — Other Ambulatory Visit: Payer: Self-pay | Admitting: Orthopaedic Surgery

## 2013-09-18 DIAGNOSIS — M546 Pain in thoracic spine: Secondary | ICD-10-CM

## 2013-09-20 ENCOUNTER — Ambulatory Visit
Admission: RE | Admit: 2013-09-20 | Discharge: 2013-09-20 | Disposition: A | Discharge status: Home / Self Care | Payer: BC Managed Care – PPO | Source: Ambulatory Visit | Attending: Orthopaedic Surgery | Admitting: Orthopaedic Surgery

## 2013-09-20 DIAGNOSIS — M546 Pain in thoracic spine: Secondary | ICD-10-CM

## 2013-10-24 ENCOUNTER — Other Ambulatory Visit: Payer: Self-pay | Admitting: Orthopaedic Surgery

## 2013-10-24 DIAGNOSIS — M4200 Juvenile osteochondrosis of spine, site unspecified: Secondary | ICD-10-CM

## 2013-10-28 ENCOUNTER — Inpatient Hospital Stay: Admission: RE | Admit: 2013-10-28 | Payer: BC Managed Care – PPO | Source: Ambulatory Visit

## 2013-11-12 ENCOUNTER — Ambulatory Visit
Admission: RE | Admit: 2013-11-12 | Discharge: 2013-11-12 | Disposition: A | Discharge status: Home / Self Care | Payer: BC Managed Care – PPO | Source: Ambulatory Visit | Attending: Orthopaedic Surgery | Admitting: Orthopaedic Surgery

## 2013-11-12 DIAGNOSIS — M4200 Juvenile osteochondrosis of spine, site unspecified: Secondary | ICD-10-CM

## 2013-11-19 ENCOUNTER — Encounter: Payer: Self-pay | Admitting: Family

## 2013-11-19 ENCOUNTER — Telehealth: Payer: Self-pay | Admitting: Family Medicine

## 2013-11-19 ENCOUNTER — Ambulatory Visit (INDEPENDENT_AMBULATORY_CARE_PROVIDER_SITE_OTHER): Payer: BC Managed Care – PPO | Admitting: Family

## 2013-11-19 VITALS — BP 112/84 | HR 99 | Wt 183.0 lb

## 2013-11-19 DIAGNOSIS — J019 Acute sinusitis, unspecified: Secondary | ICD-10-CM

## 2013-11-19 DIAGNOSIS — J309 Allergic rhinitis, unspecified: Secondary | ICD-10-CM

## 2013-11-19 DIAGNOSIS — H6690 Otitis media, unspecified, unspecified ear: Secondary | ICD-10-CM

## 2013-11-19 DIAGNOSIS — Z720 Tobacco use: Secondary | ICD-10-CM

## 2013-11-19 DIAGNOSIS — H6120 Impacted cerumen, unspecified ear: Secondary | ICD-10-CM

## 2013-11-19 DIAGNOSIS — F17200 Nicotine dependence, unspecified, uncomplicated: Secondary | ICD-10-CM

## 2013-11-19 MED ORDER — AMOXICILLIN-POT CLAVULANATE 875-125 MG PO TABS: 1.0000 | ORAL_TABLET | Freq: Two times a day (BID) | ORAL | Status: DC

## 2013-11-19 NOTE — Patient Instructions (Addendum)
Cerumen Impaction A cerumen impaction is when the wax in your ear forms a plug. This plug usually causes reduced hearing. Sometimes it also causes an earache or dizziness. Removing a cerumen impaction can be difficult and painful. The wax sticks to the ear canal. The canal is sensitive and bleeds easily. If you try to remove a heavy wax buildup with a cotton tipped swab, you may push it in further. Irrigation with water, suction, and small ear curettes may be used to clear out the wax. If the impaction is fixed to the skin in the ear canal, ear drops may be needed for a few days to loosen the wax. People who build up a lot of wax frequently can use ear wax removal products available in your local drugstore. SEEK MEDICAL CARE IF:  You develop an earache, increased hearing loss, or marked dizziness. Document Released: 09/21/2004 Document Revised: 11/06/2011 Document Reviewed: 11/11/2009 [facility] Patient Information 2014 [place], [place].   Sinusitis Sinusitis is redness, soreness, and swelling (inflammation) of the paranasal sinuses. Paranasal sinuses are air pockets within the bones of your face (beneath the eyes, the middle of the forehead, or above the eyes). In healthy paranasal sinuses, mucus is able to drain out, and air is able to circulate through them by way of your nose. However, when your paranasal sinuses are inflamed, mucus and air can become trapped. This can allow bacteria and other germs to grow and cause infection. Sinusitis can develop quickly and last only a short time (acute) or continue over a long period (chronic). Sinusitis that lasts for more than 12 weeks is considered chronic.  CAUSES  Causes of sinusitis include:  Allergies.  Structural abnormalities, such as displacement of the cartilage that separates your nostrils (deviated septum), which can decrease the air flow through your nose and sinuses and affect sinus drainage.  Functional abnormalities, such as when the small  hairs (cilia) that line your sinuses and help remove mucus do not work properly or are not present. SYMPTOMS  Symptoms of acute and chronic sinusitis are the same. The primary symptoms are pain and pressure around the affected sinuses. Other symptoms include:  Upper toothache.  Earache.  Headache.  Bad breath.  Decreased sense of smell and taste.  A cough, which worsens when you are lying flat.  Fatigue.  Fever.  Thick drainage from your nose, which often is green and may contain pus (purulent).  Swelling and warmth over the affected sinuses. DIAGNOSIS  Your caregiver will perform a physical exam. During the exam, your caregiver may:  Look in your nose for signs of abnormal growths in your nostrils (nasal polyps).  Tap over the affected sinus to check for signs of infection.  View the inside of your sinuses (endoscopy) with a special imaging device with a light attached (endoscope), which is inserted into your sinuses. If your caregiver suspects that you have chronic sinusitis, one or more of the following tests may be recommended:  Allergy tests.  Nasal culture A sample of mucus is taken from your nose and sent to a lab and screened for bacteria.  Nasal cytology A sample of mucus is taken from your nose and examined by your caregiver to determine if your sinusitis is related to an allergy. TREATMENT  Most cases of acute sinusitis are related to a viral infection and will resolve on their own within 10 days. Sometimes medicines are prescribed to help relieve symptoms (pain medicine, decongestants, nasal steroid sprays, or saline sprays).  However, for  sinusitis related to a bacterial infection, your caregiver will prescribe antibiotic medicines. These are medicines that will help kill the bacteria causing the infection.  Rarely, sinusitis is caused by a fungal infection. In theses cases, your caregiver will prescribe antifungal medicine. For some cases of chronic sinusitis,  surgery is needed. Generally, these are cases in which sinusitis recurs more than 3 times per year, despite other treatments. HOME CARE INSTRUCTIONS   Drink plenty of water. Water helps thin the mucus so your sinuses can drain more easily.  Use a humidifier.  Inhale steam 3 to 4 times a day (for example, sit in the bathroom with the shower running).  Apply a warm, moist washcloth to your face 3 to 4 times a day, or as directed by your caregiver.  Use saline nasal sprays to help moisten and clean your sinuses.  Take over-the-counter or prescription medicines for pain, discomfort, or fever only as directed by your caregiver. SEEK IMMEDIATE MEDICAL CARE IF:  You have increasing pain or severe headaches.  You have nausea, vomiting, or drowsiness.  You have swelling around your face.  You have vision problems.  You have a stiff neck.  You have difficulty breathing. MAKE SURE YOU:   Understand these instructions.  Will watch your condition.  Will get help right away if you are not doing well or get worse. Document Released: 08/14/2005 Document Revised: 11/06/2011 Document Reviewed: 08/29/2011 [facility] Patient Information 2014 [place], [place].

## 2013-11-19 NOTE — Progress Notes (Signed)
Subjective:    Patient ID: Jesse Hanna, male    DOB: 19-Sep-1965, 48 y.o.   MRN: [id number]  HPI 48 year old WM, smoker, is in today with c/o ear fullness, sinus pressure x 3 weeks and worsening. Has a history of sinusitis. Has not been taking anything for relief.    Review of Systems  Constitutional: Negative.   Respiratory: Negative.   Cardiovascular: Negative.   Gastrointestinal: Negative.   Musculoskeletal: Negative.   Skin: Negative.   Allergic/Immunologic: Negative.   Neurological: Negative.   Psychiatric/Behavioral: Negative.    Past Medical History  Diagnosis Date  . Hypertension   . Anxiety   . GERD (gastroesophageal reflux disease)   . Thoracic back pain     sees [employer] PA at [facility]   . Neck pain, chronic     sees [employer] PA at [facility]   . Skin cancer     History   Social History  . Marital Status: Married    Spouse Name: N/A    Number of Children: 1  . Years of Education: N/A   Occupational History  .     Social History Main Topics  . Smoking status: Current Every Day Smoker -- 0.50 packs/day for 15 years    Types: Cigarettes  . Smokeless tobacco: Never Used  . Alcohol Use: No  . Drug Use: No  . Sexual Activity: Not on file   Other Topics Concern  . Not on file   Social History Narrative   Married          Past Surgical History  Procedure Laterality Date  . Rotator cuff repair      left and right  . Left foot neuroma    . Epidural steroid shots      to thoracic spine  . Spinal fusion      x3 on 12/15/07 on C6-7 and repeat on 05/16/10 then on L5-S1 on 08/23/09 per Dr. Patrice Paradise  . Colonoscopy  01 25 2005    normal per Dr. Henrene Pastor  . Esophagogastroduodenoscopy  01 05 2005    per Dr. Henrene Pastor showed  Jerrye Bushy only  . Hernia repair      right x 2  . Coronary angioplasty  2003    per Dr. Melvern Banker, normal-not had to go back  . Carpal tunnel release  02/06/2012    Procedure: CARPAL TUNNEL  RELEASE;  Surgeon: Hessie Dibble, MD;  Location: [facility];  Service: Orthopedics;  Laterality: Left;    Family History  Problem Relation Age of Onset  . Hypertension    . Cancer Father     oral  . Colon polyps Mother   . Tuberculosis Father     Allergies  Allergen Reactions  . Bupropion Hcl     REACTION: unspecified  . Dilaudid [Hydromorphone Hcl]     Dizzy and burning on the skin  . Morphine And Related     GI upset  . Perindopril Erbumine     REACTION: unspecified    Current Outpatient Prescriptions on File Prior to Visit  Medication Sig Dispense Refill  . ALPRAZolam (XANAX) 0.5 MG tablet Take 1 tablet (0.5 mg total) by mouth 3 (three) times daily as needed for anxiety.  90 tablet  5  . amitriptyline (ELAVIL) 25 MG tablet Take 25 mg by mouth at bedtime. [place]       . lisinopril (PRINIVIL,ZESTRIL) 10 MG tablet Take 1 tablet (  10 mg total) by mouth daily.  30 tablet  11  . methocarbamol (ROBAXIN) 750 MG tablet Take 750 mg by mouth 4 (four) times daily.       Marland Kitchen omeprazole-sodium bicarbonate (ZEGERID) 40-1100 MG per capsule TAKE 1 CAPSULE BY MOUTH 2 (TWO) TIMES DAILY.  60 capsule  11  . OxyCODONE HCl (OXYCONTIN PO) Take 60 mg by mouth 3 (three) times daily.      . ondansetron (ZOFRAN) 4 MG tablet Take 1 tab every 6 hours as needed for nausea.  20 tablet  0   No current facility-administered medications on file prior to visit.    BP 112/84  Pulse 99  Wt 183 lb (83.008 kg)  SpO2 95%chart    Objective:   Physical Exam  Constitutional: He is oriented to person, place, and time. He appears well-developed and well-nourished.  HENT:  Nose: Nose normal.  Mouth/Throat: Oropharynx is clear and moist.  Bilateral cerumen impaction. After lavage, left OM  Neck: Normal range of motion. Neck supple.  Cardiovascular: Normal rate, regular rhythm and normal heart sounds.   Pulmonary/Chest: Effort normal and breath sounds normal.  Musculoskeletal:  Normal range of motion.  Neurological: He is alert and oriented to person, place, and time.  Skin: Skin is warm and dry.  Psychiatric: He has a normal mood and affect.      Bilateral Ear Lavage: Informed consent was obtained and peroxide gel was inserted into the ears bilaterally using the lavage kit the ears were lavaged until clean.Inspection with a cerumen spoon removed residual wax. Patient tolerated the procedure well.     Assessment & Plan:  Zaiden was seen today for ear fullness and sinusitis.  Diagnoses and associated orders for this visit:  Cerumen impaction  Allergic rhinitis  Otitis media  Acute sinusitis  Tobacco abuse  Other Orders - amoxicillin-clavulanate (AUGMENTIN) 875-125 MG per tablet; Take 1 tablet by mouth 2 (two) times daily.   Call the office if symptoms worsen or persist.

## 2013-11-19 NOTE — Telephone Encounter (Signed)
Relevant patient education mailed to patient.  

## 2013-11-19 NOTE — Progress Notes (Signed)
Pre visit review using our clinic review tool, if applicable. No additional management support is needed unless otherwise documented below in the visit note. 

## 2013-12-08 HISTORY — PX: SPINAL FUSION: SHX223

## 2013-12-18 ENCOUNTER — Other Ambulatory Visit: Payer: Self-pay | Admitting: Family Medicine

## 2013-12-18 NOTE — Telephone Encounter (Signed)
Call in #90 only. He needs testing and a contract

## 2013-12-19 NOTE — Telephone Encounter (Signed)
I tried to reach pt by phone to give message about the new screening and signing the contract, no answer.

## 2013-12-19 NOTE — Telephone Encounter (Signed)
See my note

## 2014-01-14 ENCOUNTER — Telehealth: Payer: Self-pay | Admitting: Family Medicine

## 2014-01-14 MED ORDER — ALPRAZOLAM 0.5 MG PO TABS: ORAL_TABLET | ORAL | Status: DC

## 2014-01-14 NOTE — Telephone Encounter (Signed)
done

## 2014-07-09 ENCOUNTER — Other Ambulatory Visit: Payer: Self-pay | Admitting: Family Medicine

## 2014-08-11 ENCOUNTER — Other Ambulatory Visit: Payer: Self-pay | Admitting: Family Medicine

## 2014-08-11 MED ORDER — ALPRAZOLAM 0.5 MG PO TABS: ORAL_TABLET | ORAL | Status: DC

## 2014-08-11 NOTE — Telephone Encounter (Signed)
Call in #[phone] with 5 rf 

## 2014-08-11 NOTE — Telephone Encounter (Signed)
Pt request refill of the following: ALPRAZolam (XANAX) 0.5 MG tablet   Phamacy:

## 2014-08-11 NOTE — Telephone Encounter (Signed)
Pt notified Rx called into pharmacy 

## 2014-10-29 ENCOUNTER — Encounter: Payer: Self-pay | Admitting: Family Medicine

## 2014-10-29 ENCOUNTER — Ambulatory Visit (INDEPENDENT_AMBULATORY_CARE_PROVIDER_SITE_OTHER): Payer: 59 | Admitting: Family Medicine

## 2014-10-29 VITALS — BP 136/79 | HR 88 | Temp 98.8°F | Ht 70.25 in | Wt 200.0 lb

## 2014-10-29 DIAGNOSIS — G894 Chronic pain syndrome: Secondary | ICD-10-CM

## 2014-10-29 DIAGNOSIS — J0100 Acute maxillary sinusitis, unspecified: Secondary | ICD-10-CM

## 2014-10-29 DIAGNOSIS — H60392 Other infective otitis externa, left ear: Secondary | ICD-10-CM

## 2014-10-29 MED ORDER — LISINOPRIL 10 MG PO TABS: 10.0000 mg | ORAL_TABLET | Freq: Every day | ORAL | Status: DC

## 2014-10-29 MED ORDER — NEOMYCIN-POLYMYXIN-HC 1 % OT SOLN: 3.0000 [drp] | Freq: Four times a day (QID) | OTIC | Status: DC

## 2014-10-29 MED ORDER — AMOXICILLIN-POT CLAVULANATE 875-125 MG PO TABS: 1.0000 | ORAL_TABLET | Freq: Two times a day (BID) | ORAL | Status: DC

## 2014-10-29 NOTE — Progress Notes (Signed)
Pre visit review using our clinic review tool, if applicable. No additional management support is needed unless otherwise documented below in the visit note. 

## 2014-10-29 NOTE — Progress Notes (Signed)
   Subjective:    Patient ID: Jesse Hanna, male    DOB: [birth year], 49 y.o.   MRN: [id number]  HPI Here for 4 days of sinus pressure, PND, and left ear pain. No cough or fever.    Review of Systems  Constitutional: Negative.   HENT: Positive for congestion, ear pain, postnasal drip and sinus pressure.   Eyes: Negative.   Respiratory: Negative.        Objective:   Physical Exam  Constitutional: He appears well-developed and well-nourished.  HENT:  Right Ear: External ear normal.  Nose: Nose normal.  Mouth/Throat: Oropharynx is clear and moist.  Left ear was blocked with cerumen. After this was removed the exam revealed the canal to be red and swollen, the TM is clear   Eyes: Conjunctivae are normal.  Pulmonary/Chest: Effort normal and breath sounds normal.  Lymphadenopathy:    He has no cervical adenopathy.          Assessment & Plan:  Treat the sinusitis with Augmentin and treat the otitis externa with Cortisporin Otic drops.

## 2014-10-30 ENCOUNTER — Telehealth: Payer: Self-pay | Admitting: Family Medicine

## 2014-10-30 NOTE — Telephone Encounter (Signed)
referral was placed for [facility]  Address: [id number], [place], [place] [address]  [phone] Pt going to see mary dodea - she was not listed in [facility] portal had to put referral under  Dr mark phillips  Confirmation Thank you for your online Referral submission. Your referral case information was transmitted on 10/30/2014 at 09:20 AM CST Your Referral Number is [id number]

## 2014-12-16 ENCOUNTER — Ambulatory Visit (INDEPENDENT_AMBULATORY_CARE_PROVIDER_SITE_OTHER): Payer: 59 | Admitting: Internal Medicine

## 2014-12-16 ENCOUNTER — Encounter: Payer: Self-pay | Admitting: Internal Medicine

## 2014-12-16 VITALS — BP 116/84 | Temp 98.2°F | Wt 203.4 lb

## 2014-12-16 DIAGNOSIS — Z8639 Personal history of other endocrine, nutritional and metabolic disease: Secondary | ICD-10-CM

## 2014-12-16 DIAGNOSIS — M7989 Other specified soft tissue disorders: Secondary | ICD-10-CM

## 2014-12-16 DIAGNOSIS — Z8739 Personal history of other diseases of the musculoskeletal system and connective tissue: Secondary | ICD-10-CM

## 2014-12-16 MED ORDER — DOXYCYCLINE HYCLATE 100 MG PO TABS: 100.0000 mg | ORAL_TABLET | Freq: Two times a day (BID) | ORAL | Status: DC

## 2014-12-16 NOTE — Progress Notes (Signed)
Pre visit review using our clinic review tool, if applicable. No additional management support is needed unless otherwise documented below in the visit note.  Chief Complaint  Patient presents with  . Right Hand Pain    HPI: Patient Jesse Hanna  comes in today for SDA for  new problem evaluation. PCP NA.  Onset today  Right hand  Lateral ? Could be gout  Used ice  Not ? No fever  . No hx MRSA  Hx gout foot a few year ago  cant take nsaids cause of   Gi se.  ROS: See pertinent positives and negatives per HPI.  Past Medical History  Diagnosis Date  . Hypertension   . Anxiety   . GERD (gastroesophageal reflux disease)   . Skin cancer   . Chronic pain disorder     sees Dr. Corinna Capra     Family History  Problem Relation Age of Onset  . Hypertension    . Cancer Father     oral  . Colon polyps Mother   . Tuberculosis Father     History   Social History  . Marital Status: Married    Spouse Name: N/A  . Number of Children: 1  . Years of Education: N/A   Occupational History  .     Social History Main Topics  . Smoking status: Current Every Day Smoker -- 0.50 packs/day for 15 years    Types: Cigarettes  . Smokeless tobacco: Never Used  . Alcohol Use: No  . Drug Use: No  . Sexual Activity: Not on file   Other Topics Concern  . Not on file   Social History Narrative   Married          Outpatient Encounter Prescriptions as of 12/16/2014  Medication Sig  . ALPRAZolam (XANAX) 0.5 MG tablet TAKE 1 TABLET THREE TIMES A DAY AS NEEDED FOR ANXIETY  . amitriptyline (ELAVIL) 25 MG tablet Take 75 mg by mouth at bedtime. [place]  . lisinopril (PRINIVIL,ZESTRIL) 10 MG tablet Take 1 tablet (10 mg total) by mouth daily.  . methocarbamol (ROBAXIN) 750 MG tablet Take 750 mg by mouth 4 (four) times daily.   Marland Kitchen morphine (MS CONTIN) 60 MG 12 hr tablet Take 60 mg by mouth every 8 (eight) hours.  . NEOMYCIN-POLYMYXIN-HYDROCORTISONE (CORTISPORIN) 1 % SOLN otic  solution Place 3 drops into the left ear 4 (four) times daily.  Marland Kitchen omeprazole-sodium bicarbonate (ZEGERID) 40-1100 MG per capsule TAKE 1 CAPSULE BY MOUTH 2 (TWO) TIMES DAILY.  Marland Kitchen ondansetron (ZOFRAN) 4 MG tablet Take 1 tab every 6 hours as needed for nausea.  Marland Kitchen oxyCODONE-acetaminophen (PERCOCET) 10-325 MG per tablet Take 2 tablets by mouth daily as needed for pain.  Marland Kitchen doxycycline (VIBRA-TABS) 100 MG tablet Take 1 tablet (100 mg total) by mouth 2 (two) times daily.  . OxyCODONE HCl (OXYCONTIN PO) Take 60 mg by mouth 3 (three) times daily.  . [DISCONTINUED] amoxicillin-clavulanate (AUGMENTIN) 875-125 MG per tablet Take 1 tablet by mouth 2 (two) times daily. (Patient not taking: Reported on 10/29/2014)  . [DISCONTINUED] amoxicillin-clavulanate (AUGMENTIN) 875-125 MG per tablet Take 1 tablet by mouth 2 (two) times daily.    EXAM:  BP 116/84 mmHg  Temp(Src) 98.2 F (36.8 C) (Oral)  Wt 203 lb 6.4 oz (92.262 kg)  Body mass index is 28.99 kg/(m^2).  GENERAL: vitals reviewed and listed above, alert, oriented, appears well hydrated and in no acute distress Right dorsal  Hand  Over 5th  MC  area near knuckle   bump.  And very tiny puncture?   in skin no fb seen  Warm  And tender  About 5-6 cm surrounding and pain with extension   Flexion of  little finger .  Jesse Hanna is ok .  PSYCH: pleasant and cooperative, no obvious depression or anxiety  ASSESSMENT AND PLAN:  Discussed the following assessment and plan:  Swelling of right hand - bite vs  infection  gout less likely  cover with antibiotic cause  of pain on rom of finger .    Expectant management. Close fu advised .   -Patient advised to return or notify health care team  if symptoms worsen ,persist or new concerns arise.  Patient Instructions  This may be a bite or infection. Less likely gout but possible .  Treating for infection   Or this time . Expect better in the next 2 days either way.   Elevate and rest hand .      Standley Brooking. Creta Dorame  M.D.

## 2014-12-16 NOTE — Patient Instructions (Signed)
This may be a bite or infection. Less likely gout but possible .  Treating for infection   Or this time . Expect better in the next 2 days either way.   Elevate and rest hand .

## 2014-12-17 ENCOUNTER — Ambulatory Visit: Payer: 59 | Admitting: Family Medicine

## 2015-01-31 ENCOUNTER — Other Ambulatory Visit: Payer: Self-pay | Admitting: Family Medicine

## 2015-02-01 ENCOUNTER — Other Ambulatory Visit: Payer: Self-pay | Admitting: Family Medicine

## 2015-02-01 NOTE — Telephone Encounter (Signed)
Call in #[phone] with 5 rf 

## 2015-02-01 NOTE — Telephone Encounter (Signed)
Rx last filled on 12.15.2015.  Pt last seen for an acute need on 4.20.2016

## 2015-08-09 ENCOUNTER — Telehealth: Payer: Self-pay | Admitting: Family Medicine

## 2015-08-09 NOTE — Telephone Encounter (Signed)
Call in #[phone] with 5 rf 

## 2015-08-09 NOTE — Telephone Encounter (Signed)
Pt needs a refill on his xanax please. States his December refill was expired when he tried to fill it. Please call when ready for pick up.

## 2015-08-10 MED ORDER — ALPRAZOLAM 0.5 MG PO TABS: ORAL_TABLET | ORAL | Status: DC

## 2015-08-10 NOTE — Telephone Encounter (Signed)
I called [facility], tried to reach pt and no answer or option to leave a message.

## 2015-10-01 ENCOUNTER — Other Ambulatory Visit: Payer: Self-pay | Admitting: Physical Medicine and Rehabilitation

## 2015-10-01 DIAGNOSIS — M542 Cervicalgia: Secondary | ICD-10-CM

## 2015-10-01 DIAGNOSIS — G894 Chronic pain syndrome: Secondary | ICD-10-CM

## 2015-10-08 ENCOUNTER — Ambulatory Visit (INDEPENDENT_AMBULATORY_CARE_PROVIDER_SITE_OTHER): Payer: BLUE CROSS/BLUE SHIELD | Admitting: Family Medicine

## 2015-10-08 ENCOUNTER — Encounter: Payer: Self-pay | Admitting: Family Medicine

## 2015-10-08 VITALS — BP 124/74 | HR 101 | Temp 100.0°F | Ht 70.25 in | Wt 205.0 lb

## 2015-10-08 DIAGNOSIS — J019 Acute sinusitis, unspecified: Secondary | ICD-10-CM

## 2015-10-08 MED ORDER — AMOXICILLIN-POT CLAVULANATE 875-125 MG PO TABS: 1.0000 | ORAL_TABLET | Freq: Two times a day (BID) | ORAL | Status: DC

## 2015-10-08 MED ORDER — HYDROCODONE-HOMATROPINE 5-1.5 MG/5ML PO SYRP: 5.0000 mL | ORAL_SOLUTION | ORAL | Status: DC | PRN

## 2015-10-08 NOTE — Progress Notes (Signed)
   Subjective:    Patient ID: Jesse Hanna, male    DOB: 04-03-1966, 50 y.o.   MRN: [id number]  HPI Here for 5 days of fever, HA, sinus pressure and coughing up green sputum.    Review of Systems  Constitutional: Positive for fever.  HENT: Positive for congestion, ear pain, postnasal drip, sinus pressure and sore throat.   Eyes: Negative.   Respiratory: Positive for cough.        Objective:   Physical Exam  Constitutional: He appears well-developed and well-nourished.  HENT:  Right Ear: External ear normal.  Left Ear: External ear normal.  Nose: Nose normal.  Mouth/Throat: Oropharynx is clear and moist.  Eyes: Conjunctivae are normal.  Neck: No thyromegaly present.  Cardiovascular: Normal rate, regular rhythm, normal heart sounds and intact distal pulses.   Pulmonary/Chest: Effort normal and breath sounds normal.  Lymphadenopathy:    He has no cervical adenopathy.          Assessment & Plan:  Sinusitis treat with Augmentin and Mucinex.

## 2015-10-08 NOTE — Progress Notes (Signed)
Pre visit review using our clinic review tool, if applicable. No additional management support is needed unless otherwise documented below in the visit note. 

## 2015-10-27 ENCOUNTER — Other Ambulatory Visit: Payer: BLUE CROSS/BLUE SHIELD

## 2015-11-04 ENCOUNTER — Other Ambulatory Visit: Payer: BLUE CROSS/BLUE SHIELD

## 2015-11-09 ENCOUNTER — Telehealth: Payer: Self-pay | Admitting: Family Medicine

## 2015-11-09 NOTE — Telephone Encounter (Signed)
I added this to drug allergy list.

## 2015-11-09 NOTE — Telephone Encounter (Addendum)
Pt was seen 2/10 for sinus issues. Pt is still having sinus pressure around eyes, chest congestion. Pt would like another ABX.  Pt states this has been going on since then and he not much better.  Pt had a reaction to the HYDROcodone-homatropine (HYDROMET) 5-1.5 MG/5ML syrup  Had a reaction to it, and poured it out.  Pt would like to have it listed as an allergy for him.  Pt states he has taken an entire bottle of Musinex as instructed and not sure if ok to take any more. Please advise.  [facility] [address]

## 2015-11-12 NOTE — Telephone Encounter (Signed)
Call in Levaquin 500 mg daily for 10 days  

## 2015-11-12 NOTE — Telephone Encounter (Signed)
Pt called back to ask about getting another ABX per request from 11/09/15. Pt is still having symptoms.  Please call pt when this is addressed.  [facility] [address]

## 2015-11-12 NOTE — Telephone Encounter (Signed)
Rx called in. Attempted to contact patient but was unable to reach (phone line kept ringing)

## 2015-11-15 ENCOUNTER — Ambulatory Visit
Admission: RE | Admit: 2015-11-15 | Discharge: 2015-11-15 | Disposition: A | Discharge status: Home / Self Care | Payer: BLUE CROSS/BLUE SHIELD | Source: Ambulatory Visit | Attending: Physical Medicine and Rehabilitation | Admitting: Physical Medicine and Rehabilitation

## 2015-11-15 DIAGNOSIS — G894 Chronic pain syndrome: Secondary | ICD-10-CM

## 2015-11-15 DIAGNOSIS — M542 Cervicalgia: Secondary | ICD-10-CM

## 2015-11-15 MED ORDER — GADOBENATE DIMEGLUMINE 529 MG/ML IV SOLN
20.0000 mL | Freq: Once | INTRAVENOUS | Status: AC | PRN
  Administered 2015-11-15: 19 mL via INTRAVENOUS

## 2016-01-19 ENCOUNTER — Ambulatory Visit (INDEPENDENT_AMBULATORY_CARE_PROVIDER_SITE_OTHER): Payer: BLUE CROSS/BLUE SHIELD | Admitting: Family Medicine

## 2016-01-19 ENCOUNTER — Encounter: Payer: Self-pay | Admitting: Family Medicine

## 2016-01-19 VITALS — BP 110/70 | HR 82 | Temp 98.9°F | Resp 20 | Ht 70.25 in | Wt 194.0 lb

## 2016-01-19 DIAGNOSIS — I10 Essential (primary) hypertension: Secondary | ICD-10-CM | POA: Diagnosis not present

## 2016-01-19 DIAGNOSIS — H6121 Impacted cerumen, right ear: Secondary | ICD-10-CM | POA: Diagnosis not present

## 2016-01-19 DIAGNOSIS — J019 Acute sinusitis, unspecified: Secondary | ICD-10-CM | POA: Diagnosis not present

## 2016-01-19 DIAGNOSIS — F411 Generalized anxiety disorder: Secondary | ICD-10-CM | POA: Diagnosis not present

## 2016-01-19 MED ORDER — AMOXICILLIN-POT CLAVULANATE 875-125 MG PO TABS: 1.0000 | ORAL_TABLET | Freq: Two times a day (BID) | ORAL | Status: DC

## 2016-01-19 MED ORDER — ALPRAZOLAM 0.5 MG PO TABS: ORAL_TABLET | ORAL | Status: DC

## 2016-01-19 NOTE — Progress Notes (Signed)
   Subjective:    Patient ID: Jesse Hanna, male    DOB: Oct 30, 1965, 50 y.o.   MRN: [id number]  HPI Here for one week of sinus pressure, right ear pain, PND, and ST. He has had trouble hearing out of the right ear for a few weeks.  No fever or cough. He needs refills on Xanax, which still works well. His anxiety is well controlled.    Review of Systems  Constitutional: Negative.   HENT: Positive for congestion, ear pain, hearing loss, postnasal drip, sinus pressure and sore throat.   Eyes: Negative.   Respiratory: Negative.   Neurological: Negative.   Psychiatric/Behavioral: Negative.        Objective:   Physical Exam  Constitutional: He is oriented to person, place, and time. He appears well-developed and well-nourished.  HENT:  Left Ear: External ear normal.  Nose: Nose normal.  Mouth/Throat: Oropharynx is clear and moist.  Right ear canal is full of cerumen   Eyes: Conjunctivae are normal.  Neck: No thyromegaly present.  Pulmonary/Chest: Effort normal and breath sounds normal.  Lymphadenopathy:    He has no cervical adenopathy.  Neurological: He is alert and oriented to person, place, and time.  Psychiatric: He has a normal mood and affect. His behavior is normal. Judgment and thought content normal.          Assessment & Plan:  The ear cerumen was irrigated clear with water. Treat the sinusitis with Augmentin. His anxiety is stable so we refilled his Xanax. His HTN is stable.  Laurey Morale, MD

## 2016-01-19 NOTE — Progress Notes (Signed)
Pre visit review using our clinic review tool, if applicable. No additional management support is needed unless otherwise documented below in the visit note. 

## 2016-06-28 ENCOUNTER — Telehealth: Payer: Self-pay | Admitting: Family Medicine

## 2016-06-28 NOTE — Telephone Encounter (Signed)
Pt request refill  ALPRAZolam (XANAX) 0.5 MG tablet  [facility] [address]

## 2016-06-29 NOTE — Telephone Encounter (Signed)
Call in #[phone] with 5 rf 

## 2016-07-01 ENCOUNTER — Other Ambulatory Visit: Payer: Self-pay | Admitting: Family Medicine

## 2016-07-03 MED ORDER — ALPRAZOLAM 0.5 MG PO TABS: ORAL_TABLET | ORAL | 5 refills | Status: DC

## 2016-07-03 NOTE — Telephone Encounter (Signed)
° ° °  Pt following up on the following med    ALPRAZolam (XANAX) 0.5 MG tablet    [facility] [address]

## 2016-07-03 NOTE — Telephone Encounter (Signed)
I called in script to below pharmacy.  

## 2016-10-20 DIAGNOSIS — M7551 Bursitis of right shoulder: Secondary | ICD-10-CM | POA: Diagnosis not present

## 2016-11-20 DIAGNOSIS — M7061 Trochanteric bursitis, right hip: Secondary | ICD-10-CM | POA: Diagnosis not present

## 2016-12-25 ENCOUNTER — Other Ambulatory Visit: Payer: Self-pay | Admitting: Family Medicine

## 2016-12-26 NOTE — Telephone Encounter (Signed)
Call in #90 with no rf. He needs an OV soon

## 2017-02-14 ENCOUNTER — Other Ambulatory Visit: Payer: Self-pay | Admitting: Family Medicine

## 2017-02-19 ENCOUNTER — Other Ambulatory Visit: Payer: Self-pay | Admitting: Family Medicine

## 2017-02-26 ENCOUNTER — Ambulatory Visit (INDEPENDENT_AMBULATORY_CARE_PROVIDER_SITE_OTHER): Payer: Medicare Other | Admitting: Family Medicine

## 2017-02-26 ENCOUNTER — Encounter: Payer: Self-pay | Admitting: Family Medicine

## 2017-02-26 VITALS — BP 122/96 | HR 85 | Temp 98.3°F | Ht 70.25 in | Wt 214.0 lb

## 2017-02-26 DIAGNOSIS — H60332 Swimmer's ear, left ear: Secondary | ICD-10-CM

## 2017-02-26 DIAGNOSIS — F411 Generalized anxiety disorder: Secondary | ICD-10-CM

## 2017-02-26 DIAGNOSIS — K219 Gastro-esophageal reflux disease without esophagitis: Secondary | ICD-10-CM

## 2017-02-26 DIAGNOSIS — I10 Essential (primary) hypertension: Secondary | ICD-10-CM

## 2017-02-26 LAB — POC URINALSYSI DIPSTICK (AUTOMATED)
BILIRUBIN UA: NEGATIVE
Blood, UA: NEGATIVE
Glucose, UA: NEGATIVE
KETONES UA: NEGATIVE
Leukocytes, UA: NEGATIVE
Nitrite, UA: NEGATIVE
PROTEIN UA: NEGATIVE
SPEC GRAV UA: 1.015 (ref 1.010–1.025)
Urobilinogen, UA: 0.2 E.U./dL
pH, UA: 6 (ref 5.0–8.0)

## 2017-02-26 LAB — CBC WITH DIFFERENTIAL/PLATELET
BASOS PCT: 0.9 % (ref 0.0–3.0)
Basophils Absolute: 0.1 10*3/uL (ref 0.0–0.1)
EOS PCT: 2 % (ref 0.0–5.0)
Eosinophils Absolute: 0.1 10*3/uL (ref 0.0–0.7)
HCT: 43.1 % (ref 39.0–52.0)
Hemoglobin: 14.8 g/dL (ref 13.0–17.0)
LYMPHS ABS: 2.3 10*3/uL (ref 0.7–4.0)
Lymphocytes Relative: 38.6 % (ref 12.0–46.0)
MCHC: 34.4 g/dL (ref 30.0–36.0)
MCV: 89.2 fl (ref 78.0–100.0)
MONOS PCT: 8.7 % (ref 3.0–12.0)
Monocytes Absolute: 0.5 10*3/uL (ref 0.1–1.0)
NEUTROS ABS: 2.9 10*3/uL (ref 1.4–7.7)
Neutrophils Relative %: 49.8 % (ref 43.0–77.0)
PLATELETS: 250 10*3/uL (ref 150.0–400.0)
RBC: 4.84 Mil/uL (ref 4.22–5.81)
RDW: 13.3 % (ref 11.5–15.5)
WBC: 5.9 10*3/uL (ref 4.0–10.5)

## 2017-02-26 LAB — BASIC METABOLIC PANEL
BUN: 11 mg/dL (ref 6–23)
CHLORIDE: 101 meq/L (ref 96–112)
CO2: 34 meq/L — AB (ref 19–32)
CREATININE: 0.86 mg/dL (ref 0.40–1.50)
Calcium: 9.2 mg/dL (ref 8.4–10.5)
GFR: 99.44 mL/min (ref >60.00)
Glucose, Bld: 96 mg/dL (ref 70–99)
POTASSIUM: 4.5 meq/L (ref 3.5–5.1)
Sodium: 140 mEq/L (ref 135–145)

## 2017-02-26 LAB — LIPID PANEL
Cholesterol: 214 mg/dL — ABNORMAL HIGH (ref 0–200)
HDL: 35.2 mg/dL — AB (ref >39.00)
LDL Cholesterol: 143 mg/dL — ABNORMAL HIGH (ref 0–99)
NONHDL: 178.81
TRIGLYCERIDES: 178 mg/dL — AB (ref 0.0–149.0)
Total CHOL/HDL Ratio: 6
VLDL: 35.6 mg/dL (ref 0.0–40.0)

## 2017-02-26 LAB — HEPATIC FUNCTION PANEL
ALT: 14 U/L (ref 0–53)
AST: 18 U/L (ref 0–37)
Albumin: 4.2 g/dL (ref 3.5–5.2)
Alkaline Phosphatase: 55 U/L (ref 39–117)
BILIRUBIN DIRECT: 0 mg/dL (ref 0.0–0.3)
TOTAL PROTEIN: 6.9 g/dL (ref 6.0–8.3)
Total Bilirubin: 0.3 mg/dL (ref 0.2–1.2)

## 2017-02-26 LAB — TSH: TSH: 2.03 u[IU]/mL (ref 0.35–4.50)

## 2017-02-26 MED ORDER — ALPRAZOLAM 0.5 MG PO TABS: 0.5000 mg | ORAL_TABLET | Freq: Three times a day (TID) | ORAL | 5 refills | Status: DC | PRN

## 2017-02-26 MED ORDER — NEOMYCIN-COLIST-HC-THONZONIUM 3.3-3-10-0.5 MG/ML OT SUSP: 4.0000 [drp] | Freq: Four times a day (QID) | OTIC | 2 refills | Status: DC

## 2017-02-26 MED ORDER — LISINOPRIL 10 MG PO TABS: 10.0000 mg | ORAL_TABLET | Freq: Every day | ORAL | 3 refills | Status: DC

## 2017-02-26 MED ORDER — OMEPRAZOLE-SODIUM BICARBONATE 40-1100 MG PO CAPS: ORAL_CAPSULE | ORAL | 3 refills | Status: DC

## 2017-02-26 MED ORDER — LISINOPRIL 10 MG PO TABS: 10.0000 mg | ORAL_TABLET | Freq: Every day | ORAL | 11 refills | Status: DC

## 2017-02-26 NOTE — Progress Notes (Signed)
   Subjective:    Patient ID: Jesse Hanna, male    DOB: [birth year], 51 y.o.   MRN: [id number]  HPI Here to follow up. He feels well except for some pain in the left ear. He tends to get swimmers ear every summer. He has been off BP meds for about 2 years. He has gained 20 lbs in the past year and the BP has crept back up.    Review of Systems  Constitutional: Negative.   HENT: Positive for ear pain. Negative for congestion, sinus pain and sinus pressure.   Eyes: Negative.   Respiratory: Negative.   Neurological: Negative.        Objective:   Physical Exam  Constitutional: He is oriented to person, place, and time. He appears well-developed and well-nourished.  HENT:  Right Ear: External ear normal.  Nose: Nose normal.  Mouth/Throat: Oropharynx is clear and moist.  Left ear canal is red, TM is clear   Eyes: Conjunctivae are normal.  Neck: No thyromegaly present.  Cardiovascular: Normal rate, regular rhythm, normal heart sounds and intact distal pulses.   Pulmonary/Chest: Effort normal and breath sounds normal.  Musculoskeletal: He exhibits no edema.  Lymphadenopathy:    He has no cervical adenopathy.  Neurological: He is alert and oriented to person, place, and time.          Assessment & Plan:  His HTN has flared up again so we discussed weight loss. Get back on Lisinopril 10 mg daily. Treat the otitis externa with Cortisporin drops. His anxiety is stable. His GERD is stable.  Alysia Penna, MD

## 2017-02-26 NOTE — Patient Instructions (Signed)
,  WE NOW OFFER   [facility] FAST TRACK!!!  SAME DAY Appointments for ACUTE CARE  Such as: Sprains, Injuries, cuts, abrasions, rashes, muscle pain, joint pain, back pain Colds, flu, sore throats, headache, allergies, cough, fever  Ear pain, sinus and eye infections Abdominal pain, nausea, vomiting, diarrhea, upset stomach Animal/insect bites  3 Easy Ways to Schedule: Walk-In Scheduling Call in scheduling Mychart Sign-up: [email or web address]         

## 2017-03-01 ENCOUNTER — Telehealth: Payer: Self-pay | Admitting: Family Medicine

## 2017-03-01 MED ORDER — CIPROFLOXACIN-DEXAMETHASONE 0.3-0.1 % OT SUSP: 4.0000 [drp] | Freq: Two times a day (BID) | OTIC | 1 refills | Status: DC

## 2017-03-01 NOTE — Telephone Encounter (Signed)
[facility], removed Cortisporin from current list, left message for pharmacy to remove and spoke with pt.

## 2017-03-01 NOTE — Telephone Encounter (Signed)
Switch to Ciprodex otic solution, apply 4 drops to affected ears, 10 ml with one rf

## 2017-03-01 NOTE — Telephone Encounter (Signed)
Received a fax from the pharmacy and neomycin-colistin-hydrocortisone-thonzonium (CORTISPORIN-TC) 3.10-28-08-0.5 MG/ML OTIC suspension is not in stock.  Requesting an alternative.  Please advise.  Also required prior authorization.

## 2017-03-06 NOTE — Telephone Encounter (Signed)
I left a voice message for pt, advised him to find out exactly what the pharmacy has in stock and will need to check with insurance company to find out coverage, or another option to pay out of pocket. He can call us if and when he decides to try something else.

## 2017-03-06 NOTE — Telephone Encounter (Signed)
Received fax from the pharmacy. ciprofloxacin-dexamethasone (CIPRODEX) OTIC suspension is not in stock and required a prior authorization.  Pharmacy requesting new rx.   Please advise.

## 2017-04-18 DIAGNOSIS — Z23 Encounter for immunization: Secondary | ICD-10-CM | POA: Diagnosis not present

## 2017-04-27 DIAGNOSIS — M7062 Trochanteric bursitis, left hip: Secondary | ICD-10-CM | POA: Diagnosis not present

## 2017-05-17 DIAGNOSIS — M62830 Muscle spasm of back: Secondary | ICD-10-CM | POA: Diagnosis not present

## 2017-05-17 DIAGNOSIS — M7551 Bursitis of right shoulder: Secondary | ICD-10-CM | POA: Diagnosis not present

## 2017-05-17 DIAGNOSIS — M546 Pain in thoracic spine: Secondary | ICD-10-CM | POA: Diagnosis not present

## 2017-05-17 DIAGNOSIS — K5900 Constipation, unspecified: Secondary | ICD-10-CM | POA: Diagnosis not present

## 2017-05-17 DIAGNOSIS — G4700 Insomnia, unspecified: Secondary | ICD-10-CM | POA: Diagnosis not present

## 2017-05-17 DIAGNOSIS — M7060 Trochanteric bursitis, unspecified hip: Secondary | ICD-10-CM | POA: Diagnosis not present

## 2017-05-17 DIAGNOSIS — Z79891 Long term (current) use of opiate analgesic: Secondary | ICD-10-CM | POA: Diagnosis not present

## 2017-05-17 DIAGNOSIS — M791 Myalgia: Secondary | ICD-10-CM | POA: Diagnosis not present

## 2017-05-17 DIAGNOSIS — G894 Chronic pain syndrome: Secondary | ICD-10-CM | POA: Diagnosis not present

## 2017-05-17 DIAGNOSIS — M47817 Spondylosis without myelopathy or radiculopathy, lumbosacral region: Secondary | ICD-10-CM | POA: Diagnosis not present

## 2017-08-25 ENCOUNTER — Other Ambulatory Visit: Payer: Self-pay | Admitting: Family Medicine

## 2017-08-27 NOTE — Telephone Encounter (Signed)
Last OV 02/26/2017. Rx was last refilled 02/26/2017 disp 90 with 5 refills. Sent to PCP for approval.

## 2017-08-27 NOTE — Telephone Encounter (Signed)
Call in #[phone] with 5 rf 

## 2017-08-29 ENCOUNTER — Telehealth: Payer: Self-pay | Admitting: Family Medicine

## 2017-08-29 NOTE — Telephone Encounter (Signed)
See pharmacy request. Thanks. 

## 2017-08-29 NOTE — Telephone Encounter (Signed)
Copied from [place] [id number]. Topic: Quick Communication - Rx Refill/Question >> Aug 29, 2017  9:30 AM Arletha Grippe wrote: Has the patient contacted their pharmacy? Yes.     (Agent: If no, request that the patient contact the pharmacy for the refill.)   Preferred Pharmacy (with phone number or street name): [facility] - he needs a note from Dr Sarajane Jews that acknowledges that pt takes ms contin and percocet from another provider and that Dr Sarajane Jews is ok with pt receiving xanax.  Please fax letter to [phone]   Agent: Please be advised that RX refills may take up to 3 business days. We ask that you follow-up with your pharmacy.

## 2017-08-29 NOTE — Telephone Encounter (Signed)
Sent to PCP as an Chief Operating Officer

## 2017-08-30 NOTE — Telephone Encounter (Signed)
Looks like [facility] [place] to send them a note to fax them to keep on file do I create this letter? Sent to Dr. Sarajane Jews

## 2017-08-30 NOTE — Telephone Encounter (Signed)
Yes I am aware and it is okay for him to take the Xanax

## 2017-08-31 NOTE — Telephone Encounter (Signed)
Fax Dr. Barbie Banner letter to [facility].

## 2017-08-31 NOTE — Telephone Encounter (Signed)
This is okay with me. We will fax them a note to that effect

## 2018-01-23 DIAGNOSIS — Z79891 Long term (current) use of opiate analgesic: Secondary | ICD-10-CM | POA: Diagnosis not present

## 2018-02-04 DIAGNOSIS — M47812 Spondylosis without myelopathy or radiculopathy, cervical region: Secondary | ICD-10-CM | POA: Diagnosis not present

## 2018-02-11 ENCOUNTER — Other Ambulatory Visit: Payer: Self-pay | Admitting: Family Medicine

## 2018-02-11 NOTE — Telephone Encounter (Signed)
Last OV  02/26/2017   Last refilled 08/29/2017 disp 90 with 5 refills   Sent to PCP to advise

## 2018-02-12 NOTE — Telephone Encounter (Signed)
Prescription signed into the pharmacy.

## 2018-02-12 NOTE — Telephone Encounter (Signed)
Call in #[phone] with 5 rf 

## 2018-03-18 ENCOUNTER — Emergency Department (HOSPITAL_COMMUNITY)
Admission: EM | Admit: 2018-03-18 | Discharge: 2018-03-18 | Disposition: A | Discharge status: Home / Self Care | Payer: Medicare Other | Attending: Emergency Medicine | Admitting: Emergency Medicine

## 2018-03-18 ENCOUNTER — Emergency Department (HOSPITAL_BASED_OUTPATIENT_CLINIC_OR_DEPARTMENT_OTHER): Payer: Medicare Other

## 2018-03-18 ENCOUNTER — Encounter (HOSPITAL_COMMUNITY): Payer: Self-pay | Admitting: Emergency Medicine

## 2018-03-18 ENCOUNTER — Ambulatory Visit: Payer: Self-pay | Admitting: *Deleted

## 2018-03-18 DIAGNOSIS — M7989 Other specified soft tissue disorders: Secondary | ICD-10-CM | POA: Diagnosis not present

## 2018-03-18 DIAGNOSIS — I10 Essential (primary) hypertension: Secondary | ICD-10-CM | POA: Diagnosis not present

## 2018-03-18 DIAGNOSIS — Z79899 Other long term (current) drug therapy: Secondary | ICD-10-CM | POA: Insufficient documentation

## 2018-03-18 DIAGNOSIS — F17210 Nicotine dependence, cigarettes, uncomplicated: Secondary | ICD-10-CM | POA: Diagnosis not present

## 2018-03-18 DIAGNOSIS — Z85828 Personal history of other malignant neoplasm of skin: Secondary | ICD-10-CM | POA: Insufficient documentation

## 2018-03-18 DIAGNOSIS — M79609 Pain in unspecified limb: Secondary | ICD-10-CM | POA: Diagnosis not present

## 2018-03-18 DIAGNOSIS — I80202 Phlebitis and thrombophlebitis of unspecified deep vessels of left lower extremity: Secondary | ICD-10-CM | POA: Insufficient documentation

## 2018-03-18 DIAGNOSIS — I82402 Acute embolism and thrombosis of unspecified deep veins of left lower extremity: Secondary | ICD-10-CM | POA: Diagnosis not present

## 2018-03-18 DIAGNOSIS — M79662 Pain in left lower leg: Secondary | ICD-10-CM | POA: Diagnosis not present

## 2018-03-18 LAB — BASIC METABOLIC PANEL
Anion gap: 8 (ref 5–15)
BUN: 10 mg/dL (ref 6–20)
CALCIUM: 9.1 mg/dL (ref 8.9–10.3)
CO2: 30 mmol/L (ref 22–32)
CREATININE: 0.64 mg/dL (ref 0.61–1.24)
Chloride: 103 mmol/L (ref 98–111)
GFR calc Af Amer: >60 mL/min (ref >60)
GLUCOSE: 94 mg/dL (ref 70–99)
Potassium: 4.4 mmol/L (ref 3.5–5.1)
Sodium: 141 mmol/L (ref 135–145)

## 2018-03-18 LAB — CBC WITH DIFFERENTIAL/PLATELET
Basophils Absolute: 0 10*3/uL (ref 0.0–0.1)
Basophils Relative: 1 %
EOS PCT: 3 %
Eosinophils Absolute: 0.2 10*3/uL (ref 0.0–0.7)
HEMATOCRIT: 43.2 % (ref 39.0–52.0)
Hemoglobin: 14.2 g/dL (ref 13.0–17.0)
Lymphocytes Relative: 28 %
Lymphs Abs: 2.4 10*3/uL (ref 0.7–4.0)
MCH: 29.5 pg (ref 26.0–34.0)
MCHC: 32.9 g/dL (ref 30.0–36.0)
MCV: 89.8 fL (ref 78.0–100.0)
MONO ABS: 0.5 10*3/uL (ref 0.1–1.0)
MONOS PCT: 6 %
Neutro Abs: 5.2 10*3/uL (ref 1.7–7.7)
Neutrophils Relative %: 62 %
PLATELETS: 264 10*3/uL (ref 150–400)
RBC: 4.81 MIL/uL (ref 4.22–5.81)
RDW: 13.2 % (ref 11.5–15.5)
WBC: 8.3 10*3/uL (ref 4.0–10.5)

## 2018-03-18 MED ORDER — RIVAROXABAN (XARELTO) EDUCATION KIT FOR DVT/PE PATIENTS
PACK | Freq: Once | Status: AC
  Administered 2018-03-18: 19:00:00
  Filled 2018-03-18: qty 1

## 2018-03-18 MED ORDER — RIVAROXABAN 15 MG PO TABS
15.0000 mg | ORAL_TABLET | Freq: Once | ORAL | Status: AC
  Administered 2018-03-18: 15 mg via ORAL
  Filled 2018-03-18: qty 1

## 2018-03-18 MED ORDER — RIVAROXABAN (XARELTO) VTE STARTER PACK (15 & 20 MG): ORAL_TABLET | ORAL | 0 refills | Status: DC

## 2018-03-18 NOTE — Telephone Encounter (Signed)
Patient phoned with left leg calf and ankle swelling that began 3 days ago. Calf is painful to tough and walk on. Slightly warm to touch. Denies CP/SOB/fever/cool or discolored foot. Denies any recent travels/surgeries. Stated the pain and swelling has been constant since it began Thursday. He has been elevating and using ice, nothing changes. He is on multiple pain medications through out the day for back issues, taking those medications doesn't decrease the calf pain. Rates the pain 6 on the pain scale. PCP not available. No availability today.   Reason for Disposition . [1] Thigh, calf, or ankle swelling AND [2] only 1 side  Answer Assessment - Initial Assessment Questions 1. ONSET: "When did the swelling start?" (e.g., minutes, hours, days)     Thursday  2. LOCATION: "What part of the leg is swollen?"  "Are both legs swollen or just one leg?"     Left leg swelling starting at the calf down to the ankle. 3. SEVERITY: "How bad is the swelling?" (e.g., localized; mild, moderate, severe)  - Localized - small area of swelling localized to one leg  - MILD pedal edema - swelling limited to foot and ankle, pitting edema < 1/4 inch (6 mm) deep, rest and elevation eliminate most or all swelling  - MODERATE edema - swelling of lower leg to knee, pitting edema > 1/4 inch (6 mm) deep, rest and elevation only partially reduce swelling  - SEVERE edema - swelling extends above knee, facial or hand swelling present      Moderate. Looks and feels bigger than right. 4. REDNESS: "Does the swelling look red or infected?"     no 5. PAIN: "Is the swelling painful to touch?" If so, ask: "How painful is it?"   (Scale 1-10; mild, moderate or severe)     Pain is 4 to 5 of front and back of calf.  6. FEVER: "Do you have a fever?" If so, ask: "What is it, how was it measured, and when did it start?"      Feels warm to touch. 7. CAUSE: "What do you think is causing the leg swelling?"     unsure 8. MEDICAL HISTORY:  "Do you have a history of heart failure, kidney disease, liver failure, or cancer?"    Low dose lisinopril 9. RECURRENT SYMPTOM: "Have you had leg swelling before?" If so, ask: "When was the last time?" "What happened that time?"     no 10. OTHER SYMPTOMS: "Do you have any other symptoms?" (e.g., chest pain, difficulty breathing)       no 11. PREGNANCY: "Is there any chance you are pregnant?" "When was your last menstrual period?"       na  Protocols used: LEG SWELLING AND EDEMA-A-AH

## 2018-03-18 NOTE — Progress Notes (Signed)
Left lower extremity venous duplex completed.Marland KitchenPositive for an acute DVT of the posterior tibial ,peroneal, and popliteal veins. There is no evidence of a Baker's cyst. Toma Copier.RVS 03/18/2018, 5:18 pm

## 2018-03-18 NOTE — Discharge Instructions (Addendum)
Information on my medicine - XARELTO (rivaroxaban)  This medication education was reviewed with me or my healthcare representative as part of my discharge preparation.  The pharmacist that spoke with me during my hospital stay was:  Biagio Borg, [place]? Xarelto was prescribed to treat blood clots that may have been found in the veins of your legs (deep vein thrombosis) or in your lungs (pulmonary embolism) and to reduce the risk of them occurring again.  WHAT DO YOU NEED TO KNOW ABOUT XARELTO? The starting dose is one 15 mg tablet taken TWICE daily with food for the FIRST 21 DAYS then on 04/08/18  the dose is changed to one 20 mg tablet taken ONCE A DAY with your evening meal.  DO NOT stop taking Xarelto without talking to the health care provider who prescribed the medication.  Refill your prescription for 20 mg tablets before you run out.  After discharge, you should have regular check-up appointments with your healthcare provider that is prescribing your Xarelto.  In the future your dose may need to be changed if your kidney function changes by a significant amount.  WHAT DO YOU DO IF YOU MISS A DOSE? If you are taking Xarelto TWICE DAILY and you miss a dose, take it as soon as you remember. You may take two 15 mg tablets (total 30 mg) at the same time then resume your regularly scheduled 15 mg twice daily the next day.  If you are taking Xarelto ONCE DAILY and you miss a dose, take it as soon as you remember on the same day then continue your regularly scheduled once daily regimen the next day. Do not take two doses of Xarelto at the same time.   IMPORTANT SAFETY INFORMATION Xarelto is a blood thinner medicine that can cause bleeding. You should call your healthcare provider right away if you experience any of the following: Bleeding from an injury or your nose that does not stop. Unusual colored urine (red or dark brown) or unusual  colored stools (red or black). Unusual bruising for unknown reasons. A serious fall or if you hit your head (even if there is no bleeding).  Some medicines may interact with Xarelto and might increase your risk of bleeding while on Xarelto. To help avoid this, consult your healthcare provider or pharmacist prior to using any new prescription or non-prescription medications, including herbals, vitamins, non-steroidal anti-inflammatory drugs (NSAIDs) and supplements.  This website has more information on Xarelto: [email or web address]     Please follow-up with your primary care provider as soon as possible regarding your visit today. Please return to the emergency department for any new or worsening symptoms. These take your medications as prescribed by the pharmacist. Contact a health care provider if: You miss a dose of your blood thinner. You have nausea, vomiting, or diarrhea that lasts for more than one day. Your menstrual period is heavier than usual. You have unusual bruising. Get help right away if: You have new or increased pain, swelling, or redness in an arm or leg. You have numbness or tingling in an arm or leg. You have shortness of breath. You have chest pain. You have a rapid or irregular heartbeat. You feel light-headed or dizzy. You cough up blood. There is blood in your vomit, stool, or urine. You have a serious fall or accident, or you hit your head. You have a severe headache or confusion. You have a cut that will not stop bleeding.

## 2018-03-18 NOTE — ED Provider Notes (Signed)
[facility] DEPT Provider Note   CSN: [id number] Arrival date & time: 03/18/18  1358     History   Chief Complaint Chief Complaint  Patient presents with  . Leg Pain  . Leg Swelling    HPI Jesse Hanna is a 53 y.o. male presenting for 4 days of left lower leg pain and swelling.  Patient states that his pain started gradually 4 days ago around the left calf and is gradually spread up his leg and now he is having the left knee pain as well.  Patient states that he try to go see his primary care provider today however they sent him to the emergency department for concern of blood clot.  She states that his pain is worsened with walking and palpation of the lower left leg.  Patient has taken ibuprofen for his pain without relief.  Patient describes his pain as 8/10 in severity.  Patient denies history of fever, shortness of breath, chest pain, abdominal pain, nausea, vomiting, diarrhea.  HPI  Past Medical History:  Diagnosis Date  . Anxiety   . Chronic pain disorder    sees Dr. Corinna Capra   . GERD (gastroesophageal reflux disease)   . Hypertension   . Skin cancer     Patient Active Problem List   Diagnosis Date Noted  . CARPAL TUNNEL SYNDROME 06/14/2010  . MEDIAL EPICONDYLITIS 06/14/2010  . LATERAL EPICONDYLITIS 06/14/2010  . DYSESTHESIA 02/01/2010  . Anxiety state 09/20/2007  . GERD 09/20/2007  . BACK PAIN, THORACIC REGION 09/20/2007  . CHEST PAIN 09/20/2007  . SKIN RASH 06/27/2007  . Essential hypertension 06/12/2007    Past Surgical History:  Procedure Laterality Date  . CARPAL TUNNEL RELEASE  02/06/2012   Procedure: CARPAL TUNNEL RELEASE;  Surgeon: Hessie Dibble, MD;  Location: [facility];  Service: Orthopedics;  Laterality: Left;  . COLONOSCOPY  01 25 2005   normal per Dr. Henrene Pastor  . CORONARY ANGIOPLASTY  2003   per Dr. Melvern Banker, normal-not had to go back  . epidural steroid shots     to thoracic spine  .  ESOPHAGOGASTRODUODENOSCOPY  01 05 2005   per Dr. Henrene Pastor showed  Jerrye Bushy only  . HERNIA REPAIR     right x 2  . left foot neuroma    . ROTATOR CUFF REPAIR     left and right  . SPINAL FUSION     x3 on 12/15/07 on C6-7 and repeat on 05/16/10 then on L5-S1 on 08/23/09 per Dr. Patrice Paradise  . SPINAL FUSION  12-08-13   from T3 to L3 per Dr. Rennis Harding         Home Medications    Prior to Admission medications   Medication Sig Start Date End Date Taking? Authorizing Provider  ALPRAZolam (XANAX) 0.5 MG tablet TAKE 1 TABLET BY MOUTH 3 TIMES DAILY AS NEEDED Patient taking differently: TAKE 1 TABLET BY MOUTH 3 TIMES DAILY AS NEEDED for anxiety 08/29/17  Yes Laurey Morale, MD  amitriptyline (ELAVIL) 75 MG tablet Take 75 mg by mouth at bedtime.   Yes [provider]  gabapentin (NEURONTIN) 300 MG capsule Take 600 mg by mouth at bedtime. 02/19/18  Yes [provider]  lisinopril (PRINIVIL,ZESTRIL) 10 MG tablet Take 1 tablet (10 mg total) by mouth daily. 02/26/17  Yes Laurey Morale, MD  methocarbamol (ROBAXIN) 750 MG tablet Take 750 mg by mouth 4 (four) times daily.    Yes [provider]  morphine (MS CONTIN) 60 MG 12 hr tablet Take 60 mg by mouth every 8 (eight) hours. 11/19/14  Yes [provider]  omeprazole-sodium bicarbonate (ZEGERID) 40-1100 MG capsule TAKE 1 CAPSULE BY MOUTH 2 (TWO) TIMES DAILY. 02/26/17  Yes Laurey Morale, MD  oxyCODONE-acetaminophen (PERCOCET) 10-325 MG per tablet Take 1-3 tablets by mouth daily as needed for pain.    Yes [provider]  ciprofloxacin-dexamethasone (CIPRODEX) OTIC suspension Place 4 drops into the left ear 2 (two) times daily. Patient not taking: Reported on 03/18/2018 03/01/17   Laurey Morale, MD  neomycin-colistin-hydrocortisone-thonzonium (CORTISPORIN-TC) 3.10-28-08-0.5 MG/ML OTIC suspension Place 4 drops into the left ear 4 (four) times daily. Patient not taking: Reported on 03/18/2018 02/26/17   Laurey Morale, MD  Rivaroxaban 15  & 20 MG TBPK Take as directed on package: Start with one 36m tablet by mouth twice a day with food. On Day 22, switch to one 231mtablet once a day with food. 03/18/18   MoDeliah BostonPA-C    Family History Family History  Problem Relation Age of Onset  . Hypertension Unknown   . Cancer Father        oral  . Tuberculosis Father   . Colon polyps Mother     Social History Social History   Tobacco Use  . Smoking status: Current Every Day Smoker    Packs/day: 0.50    Years: 15.00    Pack years: 7.50    Types: Cigarettes  . Smokeless tobacco: Never Used  Substance Use Topics  . Alcohol use: No    Alcohol/week: 0.0 oz  . Drug use: No     Allergies   Bupropion hcl; Dilaudid [hydromorphone hcl]; Etodolac; Hydrocodone-homatropine; Hydromorphone; Morphine and related; and Perindopril erbumine   Review of Systems Review of Systems  Constitutional: Negative.  Negative for chills, fatigue and fever.  HENT: Negative.  Negative for sore throat and trouble swallowing.   Eyes: Negative.  Negative for visual disturbance.  Respiratory: Negative.  Negative for cough, chest tightness and shortness of breath.   Cardiovascular: Positive for leg swelling. Negative for chest pain.  Gastrointestinal: Negative.  Negative for abdominal pain, blood in stool, diarrhea, nausea and vomiting.  Genitourinary: Negative.  Negative for dysuria, flank pain and hematuria.  Musculoskeletal: Positive for arthralgias. Negative for joint swelling, myalgias and neck pain.  Skin: Negative.  Negative for rash.  Neurological: Negative.  Negative for dizziness, syncope, weakness, light-headedness and headaches.     Physical Exam Updated Vital Signs BP (!) 145/85 (BP Location: Right Arm)   Pulse 69   Temp 98 F (36.7 C) (Oral)   Resp 16   Wt 97.1 kg (214 lb)   SpO2 100%   BMI 30.49 kg/m   Physical Exam  Constitutional: He is oriented to person, place, and time. He appears well-developed and  well-nourished. No distress.  HENT:  Head: Normocephalic and atraumatic.  Right Ear: External ear normal.  Left Ear: External ear normal.  Eyes: Pupils are equal, round, and reactive to light. EOM are normal.  Neck: Normal range of motion. Neck supple. No JVD present. No tracheal deviation present.  Cardiovascular: Normal rate, regular rhythm and normal heart sounds.  Pulses:      Dorsalis pedis pulses are 3+ on the right side, and 3+ on the left side.       Posterior tibial pulses are 3+ on the right side, and 3+ on the left side.  Pulmonary/Chest: Effort normal and breath sounds  normal. No respiratory distress.  Abdominal: Soft. Bowel sounds are normal. There is no tenderness. There is no rebound and no guarding.  Musculoskeletal: Normal range of motion. He exhibits no deformity.       Legs: Patient with 5 out of 5 strength to his lower extremities bilaterally, however endorses pain with range of motion of the left lower extremity.  Feet:  Right Foot:  Protective Sensation: 3 sites tested. 3 sites sensed.  Left Foot:  Protective Sensation: 3 sites tested. 3 sites sensed.  Neurological: He is alert and oriented to person, place, and time.  Skin: Skin is warm and dry. Capillary refill takes less than 2 seconds.  Psychiatric: He has a normal mood and affect. His behavior is normal.     ED Treatments / Results  Labs (all labs ordered are listed, but only abnormal results are displayed) Labs Reviewed  CBC WITH DIFFERENTIAL/PLATELET  BASIC METABOLIC PANEL    EKG None  Radiology No results found.  Procedures Procedures (including critical care time)  Medications Ordered in ED Medications  rivaroxaban [facility] Reasons) Education Kit for DVT/PE patients ( Does not apply Given 03/18/18 1858)  Rivaroxaban (XARELTO) tablet 15 mg (15 mg Oral Given 03/18/18 1900)     Initial Impression / Assessment and Plan / ED Course  I have reviewed the triage vital signs and the nursing  notes.  Pertinent labs & imaging results that were available during my care of the patient were reviewed by me and considered in my medical decision making (see chart for details).  Clinical Course as of Mar 18 2105  Mon Mar 18, 2018  [place] Pharmacist at bedside discussing treatment   [BM]  1840 Left lower extremity venous duplex completed.Marland KitchenPositive for an acute DVT of the posterior tibial ,peroneal, and popliteal veins. There is no evidence of a Baker's cyst. Toma Copier.RVS 03/18/2018, 5:18 pm     [BM]    Clinical Course User Index [BM] Deliah Boston, PA-C   Patient with left lower extremity DVT.  Patient started on Xarelto in department, patient spoken to by pharmacist who has educated patient on use of Xarelto.  Patient states that he understands that he must follow-up with his primary care provider tomorrow morning for further evaluation and continued treatment. Patient denies shortness of breath, chest pain, cough, dyspnea on exertion.  Patient with heart rate of 69, SPO2 of 100%, respiratory rate of 16.  At this time there does not appear to be any evidence of an acute emergency medical condition and the patient appears stable for discharge with appropriate outpatient follow up. Diagnosis was discussed with patient who verbalizes understanding and is agreeable to discharge. I have discussed return precautions with patient and family who verbalize understanding of return precautions. Patient strongly encouraged to follow-up with their PCP.   Patient's case discussed with Dr. Roxanne Mins who has taken over for Dr. Melina Copa at shift change, who agrees with outpatient treatment with Xarelto and follow-up with primary care.    This note was dictated using DragonOne dictation software; please contact for any inconsistencies within the note.   Final Clinical Impressions(s) / ED Diagnoses   Final diagnoses:  Deep vein thrombophlebitis of leg, left [facility])    ED Discharge Orders         Ordered    Rivaroxaban 15 & 20 MG TBPK     03/18/18 1917       Gari Crown 03/18/18 2111    Hayden Rasmussen, MD 03/19/18 [phone]

## 2018-03-18 NOTE — ED Triage Notes (Signed)
Pt having left leg pain and swelling for 4 days. PCP didn't have available appt to get in.

## 2018-03-19 ENCOUNTER — Ambulatory Visit: Payer: Medicare Other | Admitting: Internal Medicine

## 2018-03-19 ENCOUNTER — Telehealth: Payer: Self-pay | Admitting: Family Medicine

## 2018-03-19 NOTE — Telephone Encounter (Signed)
I was told that pt needed to come in today, I explained if that is the case then he will need to see another provider. He definitely wants and needs to be seen this week. Dr. Sarajane Jews only has same days for Friday, please advise?

## 2018-03-19 NOTE — Telephone Encounter (Signed)
called pt on there cell number and left a detailed VM to advise him that the PCP is out of the office and will return on Thursday to advise if he is able to see pt on Friday since all that we have available is SAME DAY appts.   Sent to PCP to advise

## 2018-03-19 NOTE — Telephone Encounter (Signed)
Copied from [place] [id number]. Topic: Quick Communication - See Telephone Encounter >> Mar 19, 2018 10:19 AM Burchel, Abbi R wrote: CRM for notification. See Telephone encounter for: 03/19/18.  Pt was seen in ED on 7/22 for blood clot in his leg.  He has a f/up sched for 7/25 with Dr Volanda Napoleon (ok'd by Sunday Spillers), bc there is no availability w/Dr Sarajane Jews this week.  Pt states he would rather see Dr Sarajane Jews for his ED f/up.  Please call pt back and resched appt if he can be worked in Dr Barbie Banner sched.    Pt: [phone]

## 2018-03-21 ENCOUNTER — Ambulatory Visit: Payer: Medicare Other | Admitting: Family Medicine

## 2018-03-21 NOTE — Telephone Encounter (Signed)
Make him an OV to see me Friday

## 2018-03-22 ENCOUNTER — Ambulatory Visit (INDEPENDENT_AMBULATORY_CARE_PROVIDER_SITE_OTHER): Payer: Medicare Other | Admitting: Family Medicine

## 2018-03-22 ENCOUNTER — Encounter: Payer: Self-pay | Admitting: Family Medicine

## 2018-03-22 VITALS — BP 122/78 | HR 88 | Temp 98.2°F | Ht 70.25 in | Wt 219.6 lb

## 2018-03-22 DIAGNOSIS — I82432 Acute embolism and thrombosis of left popliteal vein: Secondary | ICD-10-CM

## 2018-03-22 DIAGNOSIS — I82409 Acute embolism and thrombosis of unspecified deep veins of unspecified lower extremity: Secondary | ICD-10-CM | POA: Insufficient documentation

## 2018-03-22 MED ORDER — LISINOPRIL 10 MG PO TABS: 10.0000 mg | ORAL_TABLET | Freq: Every day | ORAL | 3 refills | Status: DC

## 2018-03-22 MED ORDER — OMEPRAZOLE-SODIUM BICARBONATE 40-1100 MG PO CAPS: ORAL_CAPSULE | ORAL | 3 refills | Status: DC

## 2018-03-22 MED ORDER — RIVAROXABAN 20 MG PO TABS: 20.0000 mg | ORAL_TABLET | Freq: Every day | ORAL | 5 refills | Status: DC

## 2018-03-22 NOTE — Progress Notes (Signed)
   Subjective:    Patient ID: Jesse Hanna, male    DOB: Aug 02, 1966, 52 y.o.   MRN: [id number]  HPI Here to follow up on an ER visit on 03-18-18 for swelling and pain in the left lower leg. No recent trauma or prolonged immobility. He was diagnosed with a DVT and was started on a sample pack of Xarelto. The doppler showed a DVT involving the popliteal, posterior tibial, and peroneal veins. He still has the swelling and pain. He tries to stay off his feet and keep the leg elevated. No chest pain or SOB. He has never had this before.    Review of Systems  Constitutional: Negative.   Respiratory: Negative.   Cardiovascular: Positive for leg swelling. Negative for chest pain and palpitations.  Neurological: Negative.        Objective:   Physical Exam  Constitutional: He appears well-developed and well-nourished.  Walking with a cane   Cardiovascular: Normal rate, regular rhythm, normal heart sounds and intact distal pulses.  Pulmonary/Chest: Effort normal and breath sounds normal. No stridor. No respiratory distress. He has no wheezes. He has no rales.  Musculoskeletal:  Left lower leg is swollen and tender in the calf. No cords are palpated. Bevelyn Buckles is positive. He has numerous superficial spider veins           Assessment & Plan:  Left leg DVT. He will stay on Xarelto and after the starter pack has been used up he will go to 20 mg once daily. Recheck here in 90 days.  Alysia Penna, MD

## 2018-03-22 NOTE — Telephone Encounter (Signed)
Pt has been scheduled to be seen today.

## 2018-03-22 NOTE — Telephone Encounter (Signed)
Called pt and was unable to leave a VM to call back.  

## 2018-03-29 ENCOUNTER — Ambulatory Visit: Payer: Self-pay

## 2018-03-29 NOTE — Telephone Encounter (Signed)
Sent to PCP to advise 

## 2018-03-29 NOTE — Telephone Encounter (Signed)
Pt. called to report increased swelling lf left ankle today.  Reported it has worsened during the course of the day.  Described as "noticeably larger than the right ankle."  Also reported there is a crease above and below the area of swelling.  Denied redness, warmth, or pain.  Reported has been up on feet "more than usual today", due to washing machine going out.  Stated has not missed any doses of Xarelto, since he started it.  Denied any chest pain or shortness of breath.  Questioned about ice application.  Advised that moist heat application would be more effective with swelling, r/t DVT.  Encouraged to elevate legs above level of heart intermittently.  Stated when DVT was initially diagnosed, there was a lot of pain, and pt. Pointed out that he has no pain in calf or ankle at this time.  Advised will make Dr. Sarajane Jews aware of increased swelling, and to expect a call back with any further recommendations.  Verb. Understanding.  Agrees with plan.   Called FC at office.  Advised of above complaints.  Will forward note to Dr. Sarajane Jews for review.           Reason for Disposition . Swollen ankle joint  (Exception: area of localized swelling which is itchy)    C/o increased swelling of the left ankle today; denied redness, warmth, or pain.  Reported has been on feet a lot today; "more than usual"; hasn't used his cane today.  (Recent hx left LE DVT)  Answer Assessment - Initial Assessment Questions 1. LOCATION: "Which joint is swollen?"     Left ankle swelling  2. ONSET: "When did the swelling start?"     During the day today; has been up on feet more than normal  3. SIZE: "How large is the swelling?"     Noticeable larger than right ankle  4. PAIN: "Is there any pain?" If so, ask: "How bad is it?" (Scale 1-10; or mild, moderate, severe)     Denied  5. CAUSE: "What do you think caused the swollen joint?"     Has DVT in left LE 6. OTHER SYMPTOMS: "Do you have any other symptoms?" (e.g., fever, chest pain,  difficulty breathing, calf pain)     Denied SOB, chest pain, denied left calf pain Denied redness or warmth; described a crease in the area above and below the ankle ; reported he has stood more today than he has over past week.  Protocols used: ANKLE SWELLING-A-AH

## 2018-03-29 NOTE — Telephone Encounter (Signed)
Called and spoke to pt. Pt advised and voiced understanding will follow up if needed.

## 2018-03-29 NOTE — Telephone Encounter (Signed)
Since he is already on Xarelto, I am not very worried about any new blood clots. He probably has some inflammation of the veins which makes the leg swell. I agree with warm compresses and keeping off his feet with the leg elevated. Use Tylenol for any pain. Recheck next week if needed

## 2018-04-26 ENCOUNTER — Telehealth: Payer: Self-pay | Admitting: *Deleted

## 2018-04-26 NOTE — Telephone Encounter (Signed)
Prior auth for Omeprazole 40mg /sod bicarb 1100mg  sent to [email or web address]-key [id number].

## 2018-05-02 NOTE — Telephone Encounter (Signed)
Fax received from [place] stating the request was approved from 04/26/2018-04/27/2019.  I called [facility] and informed Jerrel Ivory of this.

## 2018-05-07 DIAGNOSIS — Z23 Encounter for immunization: Secondary | ICD-10-CM | POA: Diagnosis not present

## 2018-05-20 DIAGNOSIS — G5601 Carpal tunnel syndrome, right upper limb: Secondary | ICD-10-CM | POA: Diagnosis not present

## 2018-05-20 DIAGNOSIS — M5412 Radiculopathy, cervical region: Secondary | ICD-10-CM | POA: Diagnosis not present

## 2018-05-28 ENCOUNTER — Ambulatory Visit (INDEPENDENT_AMBULATORY_CARE_PROVIDER_SITE_OTHER): Payer: Medicare Other | Admitting: Family Medicine

## 2018-05-28 ENCOUNTER — Encounter: Payer: Self-pay | Admitting: Family Medicine

## 2018-05-28 VITALS — BP 130/90 | HR 87 | Temp 98.3°F | Wt 224.2 lb

## 2018-05-28 DIAGNOSIS — I824Z2 Acute embolism and thrombosis of unspecified deep veins of left distal lower extremity: Secondary | ICD-10-CM

## 2018-05-28 DIAGNOSIS — H6690 Otitis media, unspecified, unspecified ear: Secondary | ICD-10-CM

## 2018-05-28 MED ORDER — PANTOPRAZOLE SODIUM 40 MG PO TBEC: 40.0000 mg | DELAYED_RELEASE_TABLET | Freq: Every day | ORAL | 5 refills | Status: DC

## 2018-05-28 MED ORDER — AMOXICILLIN-POT CLAVULANATE 875-125 MG PO TABS: 1.0000 | ORAL_TABLET | Freq: Two times a day (BID) | ORAL | 0 refills | Status: DC

## 2018-05-28 NOTE — Progress Notes (Signed)
   Subjective:    Patient ID: Jesse Hanna, male    DOB: May 11, 1966, 52 y.o.   MRN: [id number]  HPI Here for 2 issues. First he was diagnosed with a DVT in the left leg on 03-18-18 and was started on Xarelto. Since then the leg has totally returned to normal with no pain and no swelling. Also his fall allergies have been acting up and he has had pain in the left ear for a few days. No fever.    Review of Systems  Constitutional: Negative.   HENT: Positive for congestion, ear pain, postnasal drip and sinus pressure. Negative for ear discharge, sinus pain and sore throat.   Eyes: Negative.   Respiratory: Negative.   Cardiovascular: Negative.        Objective:   Physical Exam  Constitutional: He appears well-developed and well-nourished.  HENT:  Right Ear: External ear normal.  Nose: Nose normal.  Mouth/Throat: Oropharynx is clear and moist.  Left ear canal is red and tender, the TM is red   Eyes: Conjunctivae are normal.  Neck: No thyromegaly present.  Cardiovascular: Normal rate, regular rhythm, normal heart sounds and intact distal pulses.  Pulmonary/Chest: Effort normal and breath sounds normal. No stridor. No respiratory distress. He has no wheezes. He has no rales.  Musculoskeletal:  Left leg is normal with no swelling or cords or tenderness   Lymphadenopathy:    He has no cervical adenopathy.          Assessment & Plan:  Treat the left otitis media with Augmentin. For the DVT we will set up a follow up venous doppler for 3 weeks from now (for a 90 day follow up).  Alysia Penna, MD

## 2018-06-18 ENCOUNTER — Ambulatory Visit (HOSPITAL_COMMUNITY)
Admission: RE | Admit: 2018-06-18 | Payer: Medicare Other | Source: Ambulatory Visit | Attending: Family Medicine | Admitting: Family Medicine

## 2018-06-26 ENCOUNTER — Ambulatory Visit (HOSPITAL_COMMUNITY)
Admission: RE | Admit: 2018-06-26 | Payer: Medicare Other | Source: Ambulatory Visit | Attending: Family Medicine | Admitting: Family Medicine

## 2018-07-01 ENCOUNTER — Other Ambulatory Visit: Payer: Self-pay | Admitting: Physical Medicine and Rehabilitation

## 2018-07-01 DIAGNOSIS — M5412 Radiculopathy, cervical region: Secondary | ICD-10-CM

## 2018-07-02 ENCOUNTER — Encounter (HOSPITAL_COMMUNITY): Payer: Self-pay

## 2018-07-02 ENCOUNTER — Ambulatory Visit (HOSPITAL_COMMUNITY)
Admission: RE | Admit: 2018-07-02 | Disposition: A | Discharge status: Home / Self Care | Payer: Medicare Other | Source: Ambulatory Visit | Attending: Family Medicine | Admitting: Family Medicine

## 2018-07-02 DIAGNOSIS — R0989 Other specified symptoms and signs involving the circulatory and respiratory systems: Secondary | ICD-10-CM

## 2018-07-04 ENCOUNTER — Telehealth: Payer: Self-pay | Admitting: Family Medicine

## 2018-07-04 NOTE — Telephone Encounter (Signed)
Per pt call pt would like his order to go to [facility] for VAS [place] LOWER EXTREMITY VENOUS (DVT he wants it to  be done there instead of [facility]  need a new order placed .

## 2018-07-05 NOTE — Telephone Encounter (Signed)
Dr. Fry please advise. Thanks  

## 2018-07-05 NOTE — Telephone Encounter (Signed)
OK. Looks like this is a follow up on his DVT ordered by Dr. Sarajane Jews. Fine to send where convenient for him.

## 2018-07-05 NOTE — Telephone Encounter (Signed)
Please advise 

## 2018-07-08 NOTE — Addendum Note (Signed)
Addended by: Alysia Penna A on: 07/08/2018 07:56 AM   Modules accepted: Orders

## 2018-07-08 NOTE — Telephone Encounter (Signed)
I sent in a new order per his request

## 2018-07-09 ENCOUNTER — Other Ambulatory Visit: Payer: Self-pay | Admitting: Family Medicine

## 2018-07-09 ENCOUNTER — Other Ambulatory Visit: Payer: Self-pay

## 2018-07-09 DIAGNOSIS — I824Z2 Acute embolism and thrombosis of unspecified deep veins of left distal lower extremity: Secondary | ICD-10-CM

## 2018-07-09 DIAGNOSIS — Z86718 Personal history of other venous thrombosis and embolism: Secondary | ICD-10-CM

## 2018-07-09 NOTE — Telephone Encounter (Signed)
completed

## 2018-07-10 ENCOUNTER — Ambulatory Visit
Admission: RE | Admit: 2018-07-10 | Discharge: 2018-07-10 | Disposition: A | Discharge status: Home / Self Care | Payer: Medicare Other | Source: Ambulatory Visit | Attending: Family Medicine | Admitting: Family Medicine

## 2018-07-10 ENCOUNTER — Telehealth: Payer: Self-pay | Admitting: *Deleted

## 2018-07-10 DIAGNOSIS — Z86718 Personal history of other venous thrombosis and embolism: Secondary | ICD-10-CM | POA: Diagnosis not present

## 2018-07-10 DIAGNOSIS — I824Z2 Acute embolism and thrombosis of unspecified deep veins of left distal lower extremity: Secondary | ICD-10-CM

## 2018-07-10 NOTE — Telephone Encounter (Signed)
Copied from [place] [id number]. Topic: Quick Communication - See Telephone Encounter >> Jul 05, 2018 12:19 PM Percell Belt A wrote: CRM for notification. See Telephone encounter for: 07/05/18.  Pt called in and was suppose to have a MCR ONLY NO PAC RQD FOR [facility]  CT, it was sch'd but pt called that location and cancelled it and told them he wanted to have it a [facility].  He would like to check the status of order at [facility] call back number  [phone]   Pt is needing the Ct done at [facility].  Dr. Sarajane Jews please advise. Thanks

## 2018-07-11 NOTE — Telephone Encounter (Signed)
This message was created prior to [place] being completed. Disregard.

## 2018-07-11 NOTE — Telephone Encounter (Signed)
I have no idea what this is about. I did not order a CT. He had a venous doppler yesterday that I ordered

## 2018-07-15 ENCOUNTER — Telehealth: Payer: Self-pay | Admitting: Family Medicine

## 2018-07-15 NOTE — Telephone Encounter (Signed)
Patient calling to obtain lab results. Nurse triage currently unavailable.   Copied from [place] [id number]. Topic: Quick Communication - Lab Results (Clinic Use ONLY) >> Jul 12, 2018  1:23 PM Agnes Lawrence, CMA wrote: Called patient to inform them of lab results. When patient returns call, triage nurse may disclose results.

## 2018-07-15 NOTE — Telephone Encounter (Signed)
Dr. Sarajane Jews please advise if the pt will need to take an aspirin daily?  He stated that he has been off of the xarelto for over a month.   Thanks

## 2018-07-16 NOTE — Telephone Encounter (Signed)
I have called and left a detailed message on the pts personal VM.

## 2018-07-16 NOTE — Telephone Encounter (Signed)
Yes he should take an 81 mg aspirin daily

## 2018-07-27 ENCOUNTER — Ambulatory Visit
Admission: RE | Admit: 2018-07-27 | Discharge: 2018-07-27 | Disposition: A | Discharge status: Home / Self Care | Payer: Medicare Other | Source: Ambulatory Visit | Attending: Physical Medicine and Rehabilitation | Admitting: Physical Medicine and Rehabilitation

## 2018-07-27 DIAGNOSIS — M5412 Radiculopathy, cervical region: Secondary | ICD-10-CM

## 2018-07-27 DIAGNOSIS — M50221 Other cervical disc displacement at C4-C5 level: Secondary | ICD-10-CM | POA: Diagnosis not present

## 2018-07-27 MED ORDER — GADOBENATE DIMEGLUMINE 529 MG/ML IV SOLN
20.0000 mL | Freq: Once | INTRAVENOUS | Status: AC | PRN
Start: 2018-07-27 — End: 2018-07-27
  Administered 2018-07-27: 20 mL via INTRAVENOUS

## 2018-08-05 ENCOUNTER — Other Ambulatory Visit: Payer: Self-pay | Admitting: Family Medicine

## 2018-08-09 ENCOUNTER — Telehealth: Payer: Self-pay | Admitting: Family Medicine

## 2018-08-09 DIAGNOSIS — Z79891 Long term (current) use of opiate analgesic: Secondary | ICD-10-CM | POA: Diagnosis not present

## 2018-08-09 DIAGNOSIS — G894 Chronic pain syndrome: Secondary | ICD-10-CM | POA: Diagnosis not present

## 2018-08-09 MED ORDER — ALPRAZOLAM 0.5 MG PO TABS: 0.5000 mg | ORAL_TABLET | Freq: Three times a day (TID) | ORAL | 5 refills | Status: DC | PRN

## 2018-08-09 NOTE — Telephone Encounter (Signed)
Dr. Fry please advise. Thanks  

## 2018-08-09 NOTE — Telephone Encounter (Signed)
Call in #[phone] with 5 rf 

## 2018-08-09 NOTE — Telephone Encounter (Signed)
I have called the rx into the pharmacy.  I called the pt and left a VM on his phone.

## 2018-08-09 NOTE — Telephone Encounter (Signed)
Copied from [place] [id number]. Topic: Quick Communication - Rx Refill/Question >> Aug 09, 2018 11:26 AM Leward Quan A wrote: Medication: ALPRAZolam Duanne Moron) 0.5 MG tablet  Has the patient contacted their pharmacy? Yes.   (Agent: If no, request that the patient contact the pharmacy for the refill.) (Agent: If yes, when and what did the pharmacy advise?)  Preferred Pharmacy (with phone number or street name): [id number] [address], [place]. [phone] (Phone) [phone] (Fax)    Agent: Please be advised that RX refills may take up to 3 business days. We ask that you follow-up with your pharmacy.

## 2018-08-12 ENCOUNTER — Encounter: Payer: Self-pay | Admitting: Family Medicine

## 2018-08-12 NOTE — Telephone Encounter (Signed)
Dr. Sarajane Jews please advise on rx for the alprazolam 1 mg  Thanks

## 2018-09-19 ENCOUNTER — Ambulatory Visit
Admission: RE | Admit: 2018-09-19 | Discharge: 2018-09-19 | Disposition: A | Discharge status: Home / Self Care | Payer: Medicare Other | Source: Ambulatory Visit | Attending: Physical Medicine and Rehabilitation | Admitting: Physical Medicine and Rehabilitation

## 2018-09-19 ENCOUNTER — Other Ambulatory Visit: Payer: Self-pay | Admitting: Physical Medicine and Rehabilitation

## 2018-09-19 DIAGNOSIS — M25561 Pain in right knee: Secondary | ICD-10-CM | POA: Diagnosis not present

## 2018-09-24 ENCOUNTER — Telehealth: Payer: Self-pay | Admitting: *Deleted

## 2018-09-24 NOTE — Telephone Encounter (Signed)
Copied from [place] [id number]. Topic: General - Other >> Sep 23, 2018 12:59 PM Marin Olp L wrote: Reason for CRM: Rep with [facility] calling to see if form that was faxed last week to Dr. Sharlene Motts was received? It was to get approval for patient to stop taking aspirin in preparation for facet injection procedure. Thurmond Butts is refaxing now.   Form has been faxed back to [facility].

## 2018-10-17 ENCOUNTER — Other Ambulatory Visit: Payer: Self-pay | Admitting: Physical Medicine and Rehabilitation

## 2018-10-17 DIAGNOSIS — M25561 Pain in right knee: Secondary | ICD-10-CM

## 2018-11-08 ENCOUNTER — Other Ambulatory Visit: Payer: Medicare Other

## 2018-11-12 ENCOUNTER — Encounter: Payer: Self-pay | Admitting: Family Medicine

## 2018-11-12 ENCOUNTER — Other Ambulatory Visit: Payer: Self-pay

## 2018-11-12 ENCOUNTER — Ambulatory Visit (INDEPENDENT_AMBULATORY_CARE_PROVIDER_SITE_OTHER): Payer: Medicare Other | Admitting: Family Medicine

## 2018-11-12 VITALS — BP 110/68 | HR 96 | Temp 98.7°F | Wt 218.4 lb

## 2018-11-12 DIAGNOSIS — J019 Acute sinusitis, unspecified: Secondary | ICD-10-CM

## 2018-11-12 MED ORDER — AMOXICILLIN-POT CLAVULANATE 875-125 MG PO TABS: 1.0000 | ORAL_TABLET | Freq: Two times a day (BID) | ORAL | 0 refills | Status: DC

## 2018-11-12 NOTE — Progress Notes (Signed)
   Subjective:    Patient ID: Jesse Hanna, male    DOB: Apr 29, 1966, 53 y.o.   MRN: [id number]  HPI Here for one week of sinus congestion, PND, ear pain and a dry cough. No fever.    Review of Systems  Constitutional: Negative.   HENT: Positive for congestion, ear pain, postnasal drip and sinus pressure. Negative for sore throat.   Eyes: Negative.   Respiratory: Positive for cough.        Objective:   Physical Exam Constitutional:      Appearance: Normal appearance.  HENT:     Right Ear: Tympanic membrane and ear canal normal.     Left Ear: Tympanic membrane and ear canal normal.     Nose: Nose normal.     Mouth/Throat:     Pharynx: Oropharynx is clear.  Eyes:     Conjunctiva/sclera: Conjunctivae normal.  Pulmonary:     Effort: Pulmonary effort is normal.     Breath sounds: Normal breath sounds.  Lymphadenopathy:     Cervical: No cervical adenopathy.  Neurological:     Mental Status: He is alert.           Assessment & Plan:  Sinusitis, treat with Augmentin. Alysia Penna, MD

## 2019-01-19 ENCOUNTER — Other Ambulatory Visit: Payer: Self-pay | Admitting: Family Medicine

## 2019-01-22 NOTE — Telephone Encounter (Signed)
Pt called in to check the status of his refill request for Alprazolam. Pt says that he will be out of his medication tomorrow.    [facility]armacy:  [id number] [address], [place]. [phone] (Phone) [phone] (Fax)    Please would like a call when sent in

## 2019-01-23 NOTE — Telephone Encounter (Signed)
Patient called again to check the status of his refill request.  Patient stated that he is now completely out of the medication.  Please advise.

## 2019-01-23 NOTE — Telephone Encounter (Signed)
Pt would like a call when this is sent in.  cb is [phone] Will make an appt if needed.  He is out of meds

## 2019-01-24 NOTE — Telephone Encounter (Signed)
Dr. Fry please advise on refill. Thanks  

## 2019-01-24 NOTE — Telephone Encounter (Signed)
Pt requesting a call back when rx is sent in to pharmacy.

## 2019-01-31 DIAGNOSIS — M47812 Spondylosis without myelopathy or radiculopathy, cervical region: Secondary | ICD-10-CM | POA: Diagnosis not present

## 2019-03-26 DIAGNOSIS — Z79891 Long term (current) use of opiate analgesic: Secondary | ICD-10-CM | POA: Diagnosis not present

## 2019-03-26 DIAGNOSIS — G894 Chronic pain syndrome: Secondary | ICD-10-CM | POA: Diagnosis not present

## 2019-04-17 ENCOUNTER — Other Ambulatory Visit: Payer: Self-pay | Admitting: Family Medicine

## 2019-05-01 DIAGNOSIS — Z23 Encounter for immunization: Secondary | ICD-10-CM | POA: Diagnosis not present

## 2019-05-12 DIAGNOSIS — M47812 Spondylosis without myelopathy or radiculopathy, cervical region: Secondary | ICD-10-CM | POA: Diagnosis not present

## 2019-05-20 ENCOUNTER — Other Ambulatory Visit: Payer: Self-pay | Admitting: Family Medicine

## 2019-05-20 NOTE — Telephone Encounter (Signed)
Patient need to schedule an ov for more refills. 

## 2019-06-30 DIAGNOSIS — M791 Myalgia, unspecified site: Secondary | ICD-10-CM | POA: Diagnosis not present

## 2019-07-04 ENCOUNTER — Encounter: Payer: Self-pay | Admitting: Family Medicine

## 2019-07-04 ENCOUNTER — Telehealth (INDEPENDENT_AMBULATORY_CARE_PROVIDER_SITE_OTHER): Payer: Medicare Other | Admitting: Family Medicine

## 2019-07-04 ENCOUNTER — Other Ambulatory Visit: Payer: Self-pay

## 2019-07-04 DIAGNOSIS — J0191 Acute recurrent sinusitis, unspecified: Secondary | ICD-10-CM | POA: Diagnosis not present

## 2019-07-04 DIAGNOSIS — F411 Generalized anxiety disorder: Secondary | ICD-10-CM | POA: Diagnosis not present

## 2019-07-04 MED ORDER — AMOXICILLIN-POT CLAVULANATE 875-125 MG PO TABS: 1.0000 | ORAL_TABLET | Freq: Two times a day (BID) | ORAL | 0 refills | Status: DC

## 2019-07-04 MED ORDER — CLONAZEPAM 1 MG PO TABS: 1.0000 mg | ORAL_TABLET | Freq: Three times a day (TID) | ORAL | 5 refills | Status: DC | PRN

## 2019-07-04 NOTE — Progress Notes (Signed)
Virtual Visit via Video Note  I connected with the patient on 07/04/19 at 10:30 AM EST by a video enabled telemedicine application and verified that I am speaking with the correct person using two identifiers.  Location patient: home Location provider:work or home office Persons participating in the virtual visit: patient, provider  I discussed the limitations of evaluation and management by telemedicine and the availability of in person appointments. The patient expressed understanding and agreed to proceed.   HPI: Here for 2 issues. First he thinks he has another sinus infection. For the past 5 days he has had sinus pressure and pain above and below both eyes. He has pain in the left ear, PND, and a dry cough. No fever or body aches. No SOB or NVD. Second, he has been taking Xanax for his anxiety for a number of years, and he says it does not work as well as it used to. He wants to try Clonazepam instead. His sleep and appetite are intact.    ROS: See pertinent positives and negatives per HPI.  Past Medical History:  Diagnosis Date  . Anxiety   . Chronic pain disorder    sees Dr. Corinna Capra   . GERD (gastroesophageal reflux disease)   . Hypertension   . Skin cancer     Past Surgical History:  Procedure Laterality Date  . CARPAL TUNNEL RELEASE  02/06/2012   Procedure: CARPAL TUNNEL RELEASE;  Surgeon: Hessie Dibble, MD;  Location: [facility];  Service: Orthopedics;  Laterality: Left;  . COLONOSCOPY  01 25 2005   normal per Dr. Henrene Pastor  . CORONARY ANGIOPLASTY  2003   per Dr. Melvern Banker, normal-not had to go back  . epidural steroid shots     to thoracic spine  . ESOPHAGOGASTRODUODENOSCOPY  01 05 2005   per Dr. Henrene Pastor showed  Jerrye Bushy only  . HERNIA REPAIR     right x 2  . left foot neuroma    . ROTATOR CUFF REPAIR     left and right  . SPINAL FUSION     x3 on 12/15/07 on C6-7 and repeat on 05/16/10 then on L5-S1 on 08/23/09 per Dr. Patrice Paradise  . SPINAL FUSION  12-08-13   from T3 to L3 per Dr. Rennis Harding     Family History  Problem Relation Age of Onset  . Hypertension Other   . Cancer Father        oral  . Tuberculosis Father   . Colon polyps Mother      Current Outpatient Medications:  .  amitriptyline (ELAVIL) 75 MG tablet, Take 75 mg by mouth at bedtime., Disp: , Rfl:  .  amoxicillin-clavulanate (AUGMENTIN) 875-125 MG tablet, Take 1 tablet by mouth 2 (two) times daily., Disp: 20 tablet, Rfl: 0 .  clonazePAM (KLONOPIN) 1 MG tablet, Take 1 tablet (1 mg total) by mouth 3 (three) times daily as needed for anxiety., Disp: 90 tablet, Rfl: 5 .  gabapentin (NEURONTIN) 300 MG capsule, Take 600 mg by mouth at bedtime., Disp: , Rfl: 2 .  lisinopril (PRINIVIL,ZESTRIL) 10 MG tablet, Take 1 tablet (10 mg total) by mouth daily., Disp: 90 tablet, Rfl: 3 .  methocarbamol (ROBAXIN) 750 MG tablet, Take 750 mg by mouth 4 (four) times daily. , Disp: , Rfl:  .  morphine (MS CONTIN) 60 MG 12 hr tablet, Take 60 mg by mouth every 8 (eight) hours., Disp: , Rfl: 0 .  oxyCODONE-acetaminophen (PERCOCET) 10-325 MG per tablet, Take 1-3 tablets  by mouth daily as needed for pain. , Disp: , Rfl:  .  pantoprazole (PROTONIX) 40 MG tablet, TAKE 1 TABLET BY MOUTH EVERY DAY, Disp: 90 tablet, Rfl: 1 .  rivaroxaban (XARELTO) 20 MG TABS tablet, Take 1 tablet (20 mg total) by mouth daily with supper., Disp: 30 tablet, Rfl: 5  EXAM:  VITALS per patient if applicable:  GENERAL: alert, oriented, appears well and in no acute distress  HEENT: atraumatic, conjunttiva clear, no obvious abnormalities on inspection of external nose and ears  NECK: normal movements of the head and neck  LUNGS: on inspection no signs of respiratory distress, breathing rate appears normal, no obvious gross SOB, gasping or wheezing  CV: no obvious cyanosis  MS: moves all visible extremities without noticeable abnormality  PSYCH/NEURO: pleasant and cooperative, no obvious depression or anxiety, speech and thought  processing grossly intact  ASSESSMENT AND PLAN: For the sinusitis, treat with Augmentin. For the anxiety, switch to Clonazepam 1 mg prn.  Alysia Penna, MD  Discussed the following assessment and plan:  No diagnosis found.     I discussed the assessment and treatment plan with the patient. The patient was provided an opportunity to ask questions and all were answered. The patient agreed with the plan and demonstrated an understanding of the instructions.   The patient was advised to call back or seek an in-person evaluation if the symptoms worsen or if the condition fails to improve as anticipated.

## 2019-07-23 ENCOUNTER — Other Ambulatory Visit: Payer: Self-pay | Admitting: Family Medicine

## 2019-09-03 ENCOUNTER — Telehealth: Payer: Self-pay

## 2019-09-03 NOTE — Telephone Encounter (Signed)
Copied from [place] [id number]. Topic: General - Other >> Sep 03, 2019  1:09 PM Yvette Rack wrote: Reason for CRM: Pt stated he needs an antibiotic and normally Dr. Sarajane Jews calls in a Rx . Pt stated he does not have to come in for an appt because this occurs twice a year

## 2019-09-04 ENCOUNTER — Telehealth: Payer: Self-pay

## 2019-09-04 MED ORDER — AZITHROMYCIN 250 MG PO TABS: ORAL_TABLET | ORAL | 0 refills | Status: DC

## 2019-09-04 NOTE — Telephone Encounter (Signed)
Copied from [place] [id number]. Topic: Quick Communication - See Telephone Encounter >> Sep 04, 2019  8:58 AM Rebecca Eaton, CMA wrote: CRM for notification. See Telephone encounter for: 09/03/19. Ok to speak with patient. Please see why the patient needs an antibiotic. >> Sep 04, 2019  9:10 AM Greggory Keen D wrote: Pt called saying he just received a call from the office and he was calling back.  He states the reason he needs an antibiotic is possible sinus infection.  Left ear pain and face pain sinus area, no fever.  He is blowing out a lot of congestion,  He would like the Rx sent to [facility] where his wife gets her prescriptions.  He would like a call back to let him know when the antibiotic is called in.  [phone]

## 2019-09-04 NOTE — Telephone Encounter (Signed)
I sent in a Zpack

## 2019-09-04 NOTE — Telephone Encounter (Signed)
Left a detailed message on verified voice mail.   

## 2019-09-04 NOTE — Addendum Note (Signed)
Addended by: Alysia Penna A on: 09/04/2019 01:18 PM   Modules accepted: Orders

## 2019-09-04 NOTE — Telephone Encounter (Signed)
Left message for patient to call back. CRM created.  Please see why the patient needs and antibiotic.

## 2019-09-11 NOTE — Telephone Encounter (Signed)
Left message for patient to call back  

## 2019-09-16 NOTE — Telephone Encounter (Signed)
Unable to reach patient. Phone note will be closed.

## 2019-10-24 ENCOUNTER — Telehealth: Payer: Self-pay | Admitting: Family Medicine

## 2019-10-24 MED ORDER — ALPRAZOLAM 0.5 MG PO TABS: 0.5000 mg | ORAL_TABLET | Freq: Three times a day (TID) | ORAL | 5 refills | Status: DC | PRN

## 2019-10-24 NOTE — Telephone Encounter (Signed)
I sent in the Xanax  °

## 2019-10-24 NOTE — Telephone Encounter (Signed)
Pt is requesting to switch from the Clonazepam back to  Xanax because he is having a reaction to it. Pt would like a call once this prescription has been sent to the pharmacy.

## 2019-10-27 ENCOUNTER — Telehealth: Payer: Self-pay | Admitting: Family Medicine

## 2019-10-27 NOTE — Telephone Encounter (Signed)
Pt called on Friday regarding calling in a prescription for Xanax. Pt would appreciate a call when this is done. Thanks

## 2019-10-27 NOTE — Telephone Encounter (Signed)
Left a detailed message on verified voice mail.   

## 2019-10-27 NOTE — Telephone Encounter (Signed)
Pt states that he does not do [facility] the prescription was [facility]. He stated he does not know why it was on his chart. However he is [facility] [address] FAX: [phone]    Pt stated he is completely out and if possible he needs it today.   Pt would like a call once this is done [phone] to leave detailed message at that number per pt

## 2019-10-27 NOTE — Telephone Encounter (Signed)
Patient is aware 

## 2019-10-28 MED ORDER — ALPRAZOLAM 0.5 MG PO TABS: 0.5000 mg | ORAL_TABLET | Freq: Three times a day (TID) | ORAL | 5 refills | Status: DC | PRN

## 2019-10-28 NOTE — Addendum Note (Signed)
Addended by: Alysia Penna A on: 10/28/2019 05:11 PM   Modules accepted: Orders

## 2019-10-28 NOTE — Telephone Encounter (Signed)
Done

## 2019-10-29 NOTE — Telephone Encounter (Signed)
Left a detailed message on verified voice mail.   

## 2019-12-04 ENCOUNTER — Other Ambulatory Visit: Payer: Self-pay

## 2019-12-05 ENCOUNTER — Encounter: Payer: Self-pay | Admitting: Family Medicine

## 2019-12-05 ENCOUNTER — Ambulatory Visit (INDEPENDENT_AMBULATORY_CARE_PROVIDER_SITE_OTHER): Payer: Medicare Other | Admitting: Family Medicine

## 2019-12-05 VITALS — BP 122/70 | HR 97 | Temp 98.3°F | Wt 211.2 lb

## 2019-12-05 DIAGNOSIS — R200 Anesthesia of skin: Secondary | ICD-10-CM | POA: Diagnosis not present

## 2019-12-05 DIAGNOSIS — J019 Acute sinusitis, unspecified: Secondary | ICD-10-CM | POA: Diagnosis not present

## 2019-12-05 MED ORDER — AMOXICILLIN-POT CLAVULANATE 875-125 MG PO TABS: 1.0000 | ORAL_TABLET | Freq: Two times a day (BID) | ORAL | 0 refills | Status: DC

## 2019-12-05 NOTE — Progress Notes (Signed)
   Subjective:    Patient ID: Jesse Hanna, male    DOB: [birth year], 54 y.o.   MRN: [id number]  HPI Here with his wife for several issues. First he sees Dr. Dirk Dress for pain management, and yesterday at his visit with her he mentioned that he has had numbness in the right side of his body, including the arm, trunk, and leg. He says this started about 3 months ago and it has not change.d of course he has extensive disc disease in the spine and he has had multiple spinal surgeries. No slurred speech, or dizziness, or vision changes, or trouble walking. Dr. Greta Doom was concerned he may have had a stroke, so she asked him to see [place] about it. The other issue is his usual spring time allergies and he thinks he is getting another sinus infection. He has had one week of sinus pressure, PND, and blowing yellow mucus from the nose. No fever or cough.   Review of Systems  Constitutional: Negative.   HENT: Positive for congestion, postnasal drip and sinus pressure. Negative for rhinorrhea and sore throat.   Eyes: Negative.   Respiratory: Negative.   Cardiovascular: Negative.   Neurological: Positive for numbness. Negative for dizziness, tremors, seizures, syncope, facial asymmetry, speech difficulty, weakness, light-headedness and headaches.       Objective:   Physical Exam Constitutional:      Appearance: Normal appearance.  HENT:     Right Ear: Tympanic membrane, ear canal and external ear normal.     Left Ear: Tympanic membrane, ear canal and external ear normal.     Nose: Nose normal.     Mouth/Throat:     Pharynx: Oropharynx is clear.  Eyes:     Conjunctiva/sclera: Conjunctivae normal.  Cardiovascular:     Rate and Rhythm: Normal rate and regular rhythm.     Pulses: Normal pulses.     Heart sounds: Normal heart sounds.  Pulmonary:     Effort: Pulmonary effort is normal.     Breath sounds: Normal breath sounds.  Lymphadenopathy:     Cervical: No cervical adenopathy.   Neurological:     General: No focal deficit present.     Mental Status: He is alert and oriented to person, place, and time.     Cranial Nerves: No cranial nerve deficit.     Motor: No weakness.     Coordination: Coordination normal.     Gait: Gait normal.           Assessment & Plan:  I reassured him that he has not had a stroke. He has extensive nerve damage along the spine, and this is the source of the numbness he describes. He also has a sinusitis, and we will treat this with Augmentin. Alysia Penna, MD

## 2019-12-22 ENCOUNTER — Telehealth: Payer: Self-pay | Admitting: Family Medicine

## 2019-12-22 DIAGNOSIS — M546 Pain in thoracic spine: Secondary | ICD-10-CM

## 2019-12-22 DIAGNOSIS — M542 Cervicalgia: Secondary | ICD-10-CM

## 2019-12-22 DIAGNOSIS — M5442 Lumbago with sciatica, left side: Secondary | ICD-10-CM

## 2019-12-22 DIAGNOSIS — G8929 Other chronic pain: Secondary | ICD-10-CM

## 2019-12-22 DIAGNOSIS — Z981 Arthrodesis status: Secondary | ICD-10-CM

## 2019-12-22 DIAGNOSIS — Z4889 Encounter for other specified surgical aftercare: Secondary | ICD-10-CM

## 2019-12-22 NOTE — Telephone Encounter (Signed)
These were ordered prior to seeing Dr. Rennis Harding on 12-29-19

## 2019-12-28 DIAGNOSIS — H10022 Other mucopurulent conjunctivitis, left eye: Secondary | ICD-10-CM | POA: Diagnosis not present

## 2019-12-29 DIAGNOSIS — M4005 Postural kyphosis, thoracolumbar region: Secondary | ICD-10-CM | POA: Diagnosis not present

## 2019-12-29 DIAGNOSIS — M542 Cervicalgia: Secondary | ICD-10-CM | POA: Diagnosis not present

## 2019-12-29 DIAGNOSIS — M545 Low back pain: Secondary | ICD-10-CM | POA: Diagnosis not present

## 2019-12-29 DIAGNOSIS — G894 Chronic pain syndrome: Secondary | ICD-10-CM | POA: Diagnosis not present

## 2019-12-29 DIAGNOSIS — M791 Myalgia, unspecified site: Secondary | ICD-10-CM | POA: Diagnosis not present

## 2020-01-01 ENCOUNTER — Other Ambulatory Visit: Payer: Self-pay | Admitting: Orthopaedic Surgery

## 2020-01-01 DIAGNOSIS — M62830 Muscle spasm of back: Secondary | ICD-10-CM | POA: Diagnosis not present

## 2020-01-01 DIAGNOSIS — G4700 Insomnia, unspecified: Secondary | ICD-10-CM | POA: Diagnosis not present

## 2020-01-01 DIAGNOSIS — G8929 Other chronic pain: Secondary | ICD-10-CM

## 2020-01-01 DIAGNOSIS — M546 Pain in thoracic spine: Secondary | ICD-10-CM

## 2020-01-01 DIAGNOSIS — M47817 Spondylosis without myelopathy or radiculopathy, lumbosacral region: Secondary | ICD-10-CM | POA: Diagnosis not present

## 2020-01-01 DIAGNOSIS — G894 Chronic pain syndrome: Secondary | ICD-10-CM | POA: Diagnosis not present

## 2020-01-05 ENCOUNTER — Telehealth: Payer: Self-pay | Admitting: Nurse Practitioner

## 2020-01-05 NOTE — Telephone Encounter (Signed)
Phone call to patient to verify medication list and allergies for myelogram procedure. Pt instructed to hold amitriptyline for 48hrs prior to myelogram appointment time. Pt verbalized understanding. Pre and post procedure instructions reviewed with pt. 

## 2020-01-20 ENCOUNTER — Other Ambulatory Visit: Payer: Medicare Other

## 2020-01-20 ENCOUNTER — Inpatient Hospital Stay: Admission: RE | Admit: 2020-01-20 | Payer: Medicare Other | Source: Ambulatory Visit

## 2020-01-29 DIAGNOSIS — M62830 Muscle spasm of back: Secondary | ICD-10-CM | POA: Diagnosis not present

## 2020-01-29 DIAGNOSIS — G894 Chronic pain syndrome: Secondary | ICD-10-CM | POA: Diagnosis not present

## 2020-01-29 DIAGNOSIS — M47817 Spondylosis without myelopathy or radiculopathy, lumbosacral region: Secondary | ICD-10-CM | POA: Diagnosis not present

## 2020-01-29 DIAGNOSIS — G4700 Insomnia, unspecified: Secondary | ICD-10-CM | POA: Diagnosis not present

## 2020-01-30 ENCOUNTER — Ambulatory Visit
Admission: RE | Admit: 2020-01-30 | Discharge: 2020-01-30 | Disposition: A | Discharge status: Home / Self Care | Payer: Medicare Other | Source: Ambulatory Visit | Attending: Orthopaedic Surgery | Admitting: Orthopaedic Surgery

## 2020-01-30 ENCOUNTER — Other Ambulatory Visit: Payer: Self-pay

## 2020-01-30 DIAGNOSIS — M5126 Other intervertebral disc displacement, lumbar region: Secondary | ICD-10-CM | POA: Diagnosis not present

## 2020-01-30 DIAGNOSIS — G8929 Other chronic pain: Secondary | ICD-10-CM

## 2020-01-30 DIAGNOSIS — M4327 Fusion of spine, lumbosacral region: Secondary | ICD-10-CM | POA: Diagnosis not present

## 2020-01-30 DIAGNOSIS — M4324 Fusion of spine, thoracic region: Secondary | ICD-10-CM | POA: Diagnosis not present

## 2020-01-30 DIAGNOSIS — M546 Pain in thoracic spine: Secondary | ICD-10-CM

## 2020-01-30 DIAGNOSIS — M545 Low back pain, unspecified: Secondary | ICD-10-CM

## 2020-01-30 MED ORDER — IOPAMIDOL (ISOVUE-M 300) INJECTION 61%
10.0000 mL | Freq: Once | INTRAMUSCULAR | Status: AC | PRN
  Administered 2020-01-30: 10 mL via INTRATHECAL

## 2020-01-30 NOTE — Discharge Instructions (Signed)
Myelogram Discharge Instructions  1. Go home and rest quietly for the next 24 hours.  It is important to lie flat for the next 24 hours.  Get up only to go to the restroom.  You may lie in the bed or on a couch on your back, your stomach, your left side or your right side.  You may have one pillow under your head.  You may have pillows between your knees while you are on your side or under your knees while you are on your back.  2. DO NOT drive today.  Recline the seat as far back as it will go, while still wearing your seat belt, on the way home.  3. You may get up to go to the bathroom as needed.  You may sit up for 10 minutes to eat.  You may resume your normal diet and medications unless otherwise indicated.  Drink lots of extra fluids today and tomorrow.  4. The incidence of headache, nausea, or vomiting is about 5% (one in 20 patients).  If you develop a headache, lie flat and drink plenty of fluids until the headache goes away.  Caffeinated beverages may be helpful.  If you develop severe nausea and vomiting or a headache that does not go away with flat bed rest, call [phone].  5. You may resume normal activities after your 24 hours of bed rest is over; however, do not exert yourself strongly or do any heavy lifting tomorrow. If when you get up you have a headache when standing, go back to bed and force fluids for another 24 hours.  6. Call your physician for a follow-up appointment.  The results of your myelogram will be sent directly to your physician by the following day.  7. If you have any questions or if complications develop after you arrive home, please call [phone].  Discharge instructions have been explained to the patient.  The patient, or the person responsible for the patient, fully understands these instructions.  YOU MAY RESTART YOUR AMITRIPTYLINE TOMORROW 01/31/20 AT 09:30AM.

## 2020-02-24 ENCOUNTER — Ambulatory Visit (INDEPENDENT_AMBULATORY_CARE_PROVIDER_SITE_OTHER): Payer: Medicare Other | Admitting: Family Medicine

## 2020-02-24 ENCOUNTER — Encounter: Payer: Self-pay | Admitting: Family Medicine

## 2020-02-24 ENCOUNTER — Other Ambulatory Visit: Payer: Self-pay

## 2020-02-24 VITALS — BP 124/60 | HR 103 | Temp 98.7°F | Wt 214.8 lb

## 2020-02-24 DIAGNOSIS — I10 Essential (primary) hypertension: Secondary | ICD-10-CM | POA: Diagnosis not present

## 2020-02-24 DIAGNOSIS — M546 Pain in thoracic spine: Secondary | ICD-10-CM

## 2020-02-24 NOTE — Progress Notes (Signed)
   Subjective:    Patient ID: Jesse Hanna, male    DOB: Jul 06, 1966, 54 y.o.   MRN: [id number]  HPI Here for medical clearance prior to a planned surgery soon per Dr. Rennis Harding. He plans to perform a fusion from T12 down to the sacrum. Other than the back pain, Jesse Hanna has been doing well and has no concerns.    Review of Systems  Constitutional: Negative.   Respiratory: Negative.   Cardiovascular: Negative.   Musculoskeletal: Positive for back pain.       Objective:   Physical Exam Constitutional:      Appearance: Normal appearance.  Cardiovascular:     Rate and Rhythm: Normal rate and regular rhythm.     Pulses: Normal pulses.     Heart sounds: Normal heart sounds.  Pulmonary:     Effort: Pulmonary effort is normal.     Breath sounds: Normal breath sounds.  Neurological:     Mental Status: He is alert.           Assessment & Plan:  He is cleared medically to undergo the spinal surgery above. Follow up prn.  Jesse Penna, MD

## 2020-03-11 DIAGNOSIS — Z23 Encounter for immunization: Secondary | ICD-10-CM | POA: Diagnosis not present

## 2020-03-24 DIAGNOSIS — M62830 Muscle spasm of back: Secondary | ICD-10-CM | POA: Diagnosis not present

## 2020-03-24 DIAGNOSIS — M47817 Spondylosis without myelopathy or radiculopathy, lumbosacral region: Secondary | ICD-10-CM | POA: Diagnosis not present

## 2020-03-24 DIAGNOSIS — G894 Chronic pain syndrome: Secondary | ICD-10-CM | POA: Diagnosis not present

## 2020-03-24 DIAGNOSIS — G4700 Insomnia, unspecified: Secondary | ICD-10-CM | POA: Diagnosis not present

## 2020-04-05 ENCOUNTER — Telehealth: Payer: Self-pay | Admitting: Family Medicine

## 2020-04-05 MED ORDER — CLONAZEPAM 1 MG PO TABS
1.0000 mg | ORAL_TABLET | Freq: Three times a day (TID) | ORAL | 5 refills | Status: DC | PRN
Start: 2020-04-05 — End: 2020-04-12

## 2020-04-05 NOTE — Telephone Encounter (Signed)
Spoke with the patient. He is aware that a new rx for clonazepam has been sent in.

## 2020-04-05 NOTE — Telephone Encounter (Signed)
Spoke with the patient. He stated that he has enough medication to last him until august 19th. Per the pharmacy, this is his last refills, he does not have one for September. Please advise.

## 2020-04-05 NOTE — Telephone Encounter (Signed)
He currently has enough Xanax to last until Sept 2. Have him contact us in late August and we can make the change then

## 2020-04-05 NOTE — Telephone Encounter (Signed)
I understand. I sent in the Clonazepam

## 2020-04-05 NOTE — Telephone Encounter (Signed)
Pt call and stated he want to come off ALPRAZolam (XANAX) 0.5 MG tablet and go back on Clonazepam 1 mg tabet and want it sent to [place] he want a call back.

## 2020-04-09 DIAGNOSIS — R079 Chest pain, unspecified: Secondary | ICD-10-CM | POA: Diagnosis not present

## 2020-04-09 DIAGNOSIS — K219 Gastro-esophageal reflux disease without esophagitis: Secondary | ICD-10-CM | POA: Diagnosis not present

## 2020-04-09 DIAGNOSIS — I82409 Acute embolism and thrombosis of unspecified deep veins of unspecified lower extremity: Secondary | ICD-10-CM | POA: Diagnosis not present

## 2020-04-09 DIAGNOSIS — M4326 Fusion of spine, lumbar region: Secondary | ICD-10-CM | POA: Diagnosis not present

## 2020-04-09 DIAGNOSIS — R209 Unspecified disturbances of skin sensation: Secondary | ICD-10-CM | POA: Diagnosis not present

## 2020-04-09 DIAGNOSIS — M4324 Fusion of spine, thoracic region: Secondary | ICD-10-CM | POA: Diagnosis not present

## 2020-04-09 DIAGNOSIS — Z7982 Long term (current) use of aspirin: Secondary | ICD-10-CM | POA: Diagnosis not present

## 2020-04-09 DIAGNOSIS — M4716 Other spondylosis with myelopathy, lumbar region: Secondary | ICD-10-CM | POA: Diagnosis not present

## 2020-04-09 DIAGNOSIS — F411 Generalized anxiety disorder: Secondary | ICD-10-CM | POA: Diagnosis not present

## 2020-04-09 DIAGNOSIS — Z79899 Other long term (current) drug therapy: Secondary | ICD-10-CM | POA: Diagnosis not present

## 2020-04-09 DIAGNOSIS — Z01818 Encounter for other preprocedural examination: Secondary | ICD-10-CM | POA: Diagnosis not present

## 2020-04-09 DIAGNOSIS — I10 Essential (primary) hypertension: Secondary | ICD-10-CM | POA: Diagnosis not present

## 2020-04-09 DIAGNOSIS — M546 Pain in thoracic spine: Secondary | ICD-10-CM | POA: Diagnosis not present

## 2020-04-09 DIAGNOSIS — G894 Chronic pain syndrome: Secondary | ICD-10-CM | POA: Diagnosis not present

## 2020-04-09 DIAGNOSIS — G5600 Carpal tunnel syndrome, unspecified upper limb: Secondary | ICD-10-CM | POA: Diagnosis not present

## 2020-04-11 ENCOUNTER — Other Ambulatory Visit: Payer: Self-pay | Admitting: Family Medicine

## 2020-04-12 DIAGNOSIS — I82409 Acute embolism and thrombosis of unspecified deep veins of unspecified lower extremity: Secondary | ICD-10-CM | POA: Diagnosis not present

## 2020-04-12 DIAGNOSIS — Z01810 Encounter for preprocedural cardiovascular examination: Secondary | ICD-10-CM | POA: Diagnosis not present

## 2020-04-22 DIAGNOSIS — G894 Chronic pain syndrome: Secondary | ICD-10-CM | POA: Diagnosis not present

## 2020-04-22 DIAGNOSIS — G4700 Insomnia, unspecified: Secondary | ICD-10-CM | POA: Diagnosis not present

## 2020-04-22 DIAGNOSIS — M62830 Muscle spasm of back: Secondary | ICD-10-CM | POA: Diagnosis not present

## 2020-04-22 DIAGNOSIS — M47817 Spondylosis without myelopathy or radiculopathy, lumbosacral region: Secondary | ICD-10-CM | POA: Diagnosis not present

## 2020-04-28 DIAGNOSIS — Z23 Encounter for immunization: Secondary | ICD-10-CM | POA: Diagnosis not present

## 2020-05-20 DIAGNOSIS — M62830 Muscle spasm of back: Secondary | ICD-10-CM | POA: Diagnosis not present

## 2020-05-20 DIAGNOSIS — M47817 Spondylosis without myelopathy or radiculopathy, lumbosacral region: Secondary | ICD-10-CM | POA: Diagnosis not present

## 2020-05-20 DIAGNOSIS — G4700 Insomnia, unspecified: Secondary | ICD-10-CM | POA: Diagnosis not present

## 2020-05-20 DIAGNOSIS — G894 Chronic pain syndrome: Secondary | ICD-10-CM | POA: Diagnosis not present

## 2020-05-20 DIAGNOSIS — Z79891 Long term (current) use of opiate analgesic: Secondary | ICD-10-CM | POA: Diagnosis not present

## 2020-06-09 ENCOUNTER — Other Ambulatory Visit: Payer: Self-pay | Admitting: Family Medicine

## 2020-06-17 DIAGNOSIS — M47817 Spondylosis without myelopathy or radiculopathy, lumbosacral region: Secondary | ICD-10-CM | POA: Diagnosis not present

## 2020-06-17 DIAGNOSIS — M62830 Muscle spasm of back: Secondary | ICD-10-CM | POA: Diagnosis not present

## 2020-06-17 DIAGNOSIS — G894 Chronic pain syndrome: Secondary | ICD-10-CM | POA: Diagnosis not present

## 2020-06-17 DIAGNOSIS — G4700 Insomnia, unspecified: Secondary | ICD-10-CM | POA: Diagnosis not present

## 2020-07-02 DIAGNOSIS — G894 Chronic pain syndrome: Secondary | ICD-10-CM | POA: Diagnosis not present

## 2020-07-12 DIAGNOSIS — Z981 Arthrodesis status: Secondary | ICD-10-CM | POA: Diagnosis not present

## 2020-07-12 DIAGNOSIS — M48062 Spinal stenosis, lumbar region with neurogenic claudication: Secondary | ICD-10-CM | POA: Diagnosis not present

## 2020-07-12 DIAGNOSIS — M5136 Other intervertebral disc degeneration, lumbar region: Secondary | ICD-10-CM | POA: Diagnosis not present

## 2020-07-12 DIAGNOSIS — T84216A Breakdown (mechanical) of internal fixation device of vertebrae, initial encounter: Secondary | ICD-10-CM | POA: Diagnosis not present

## 2020-07-12 DIAGNOSIS — M4716 Other spondylosis with myelopathy, lumbar region: Secondary | ICD-10-CM | POA: Diagnosis not present

## 2020-07-12 DIAGNOSIS — Z7409 Other reduced mobility: Secondary | ICD-10-CM | POA: Diagnosis not present

## 2020-07-12 DIAGNOSIS — M48061 Spinal stenosis, lumbar region without neurogenic claudication: Secondary | ICD-10-CM | POA: Diagnosis not present

## 2020-07-12 DIAGNOSIS — M961 Postlaminectomy syndrome, not elsewhere classified: Secondary | ICD-10-CM | POA: Diagnosis not present

## 2020-07-12 DIAGNOSIS — M4726 Other spondylosis with radiculopathy, lumbar region: Secondary | ICD-10-CM | POA: Diagnosis not present

## 2020-07-12 DIAGNOSIS — M4326 Fusion of spine, lumbar region: Secondary | ICD-10-CM | POA: Diagnosis not present

## 2020-07-12 DIAGNOSIS — M960 Pseudarthrosis after fusion or arthrodesis: Secondary | ICD-10-CM | POA: Diagnosis not present

## 2020-07-13 DIAGNOSIS — M5136 Other intervertebral disc degeneration, lumbar region: Secondary | ICD-10-CM | POA: Diagnosis not present

## 2020-07-13 DIAGNOSIS — M960 Pseudarthrosis after fusion or arthrodesis: Secondary | ICD-10-CM | POA: Diagnosis not present

## 2020-07-13 DIAGNOSIS — Z7409 Other reduced mobility: Secondary | ICD-10-CM | POA: Diagnosis not present

## 2020-07-13 DIAGNOSIS — M4726 Other spondylosis with radiculopathy, lumbar region: Secondary | ICD-10-CM | POA: Diagnosis not present

## 2020-07-13 DIAGNOSIS — M48061 Spinal stenosis, lumbar region without neurogenic claudication: Secondary | ICD-10-CM | POA: Diagnosis not present

## 2020-07-13 DIAGNOSIS — M4326 Fusion of spine, lumbar region: Secondary | ICD-10-CM | POA: Diagnosis not present

## 2020-07-13 DIAGNOSIS — M961 Postlaminectomy syndrome, not elsewhere classified: Secondary | ICD-10-CM | POA: Diagnosis not present

## 2020-07-15 DIAGNOSIS — D509 Iron deficiency anemia, unspecified: Secondary | ICD-10-CM | POA: Diagnosis not present

## 2020-07-15 DIAGNOSIS — M4716 Other spondylosis with myelopathy, lumbar region: Secondary | ICD-10-CM | POA: Diagnosis not present

## 2020-07-15 DIAGNOSIS — G5600 Carpal tunnel syndrome, unspecified upper limb: Secondary | ICD-10-CM | POA: Diagnosis not present

## 2020-07-15 DIAGNOSIS — I10 Essential (primary) hypertension: Secondary | ICD-10-CM | POA: Diagnosis not present

## 2020-07-15 DIAGNOSIS — G894 Chronic pain syndrome: Secondary | ICD-10-CM | POA: Diagnosis not present

## 2020-07-15 DIAGNOSIS — Z7982 Long term (current) use of aspirin: Secondary | ICD-10-CM | POA: Diagnosis not present

## 2020-07-15 DIAGNOSIS — F17210 Nicotine dependence, cigarettes, uncomplicated: Secondary | ICD-10-CM | POA: Diagnosis not present

## 2020-07-15 DIAGNOSIS — Z86718 Personal history of other venous thrombosis and embolism: Secondary | ICD-10-CM | POA: Diagnosis not present

## 2020-07-15 DIAGNOSIS — T84216D Breakdown (mechanical) of internal fixation device of vertebrae, subsequent encounter: Secondary | ICD-10-CM | POA: Diagnosis not present

## 2020-07-15 DIAGNOSIS — M961 Postlaminectomy syndrome, not elsewhere classified: Secondary | ICD-10-CM | POA: Diagnosis not present

## 2020-07-15 DIAGNOSIS — M419 Scoliosis, unspecified: Secondary | ICD-10-CM | POA: Diagnosis not present

## 2020-07-15 DIAGNOSIS — Z79891 Long term (current) use of opiate analgesic: Secondary | ICD-10-CM | POA: Diagnosis not present

## 2020-07-15 DIAGNOSIS — M48061 Spinal stenosis, lumbar region without neurogenic claudication: Secondary | ICD-10-CM | POA: Diagnosis not present

## 2020-07-15 DIAGNOSIS — Z9181 History of falling: Secondary | ICD-10-CM | POA: Diagnosis not present

## 2020-07-15 DIAGNOSIS — M960 Pseudarthrosis after fusion or arthrodesis: Secondary | ICD-10-CM | POA: Diagnosis not present

## 2020-07-15 DIAGNOSIS — F411 Generalized anxiety disorder: Secondary | ICD-10-CM | POA: Diagnosis not present

## 2020-07-15 DIAGNOSIS — M4726 Other spondylosis with radiculopathy, lumbar region: Secondary | ICD-10-CM | POA: Diagnosis not present

## 2020-07-15 DIAGNOSIS — M5134 Other intervertebral disc degeneration, thoracic region: Secondary | ICD-10-CM | POA: Diagnosis not present

## 2020-07-19 DIAGNOSIS — T84216D Breakdown (mechanical) of internal fixation device of vertebrae, subsequent encounter: Secondary | ICD-10-CM | POA: Diagnosis not present

## 2020-07-19 DIAGNOSIS — M4716 Other spondylosis with myelopathy, lumbar region: Secondary | ICD-10-CM | POA: Diagnosis not present

## 2020-07-19 DIAGNOSIS — M960 Pseudarthrosis after fusion or arthrodesis: Secondary | ICD-10-CM | POA: Diagnosis not present

## 2020-07-19 DIAGNOSIS — M961 Postlaminectomy syndrome, not elsewhere classified: Secondary | ICD-10-CM | POA: Diagnosis not present

## 2020-07-19 DIAGNOSIS — M48061 Spinal stenosis, lumbar region without neurogenic claudication: Secondary | ICD-10-CM | POA: Diagnosis not present

## 2020-07-19 DIAGNOSIS — M4726 Other spondylosis with radiculopathy, lumbar region: Secondary | ICD-10-CM | POA: Diagnosis not present

## 2020-07-20 ENCOUNTER — Encounter: Payer: Self-pay | Admitting: Family Medicine

## 2020-07-20 ENCOUNTER — Ambulatory Visit (INDEPENDENT_AMBULATORY_CARE_PROVIDER_SITE_OTHER): Payer: Medicare Other | Admitting: Family Medicine

## 2020-07-20 ENCOUNTER — Other Ambulatory Visit: Payer: Self-pay

## 2020-07-20 VITALS — BP 110/60 | HR 93 | Temp 98.2°F | Wt 223.5 lb

## 2020-07-20 DIAGNOSIS — T783XXA Angioneurotic edema, initial encounter: Secondary | ICD-10-CM

## 2020-07-20 DIAGNOSIS — T7840XA Allergy, unspecified, initial encounter: Secondary | ICD-10-CM | POA: Diagnosis not present

## 2020-07-20 DIAGNOSIS — G894 Chronic pain syndrome: Secondary | ICD-10-CM | POA: Diagnosis not present

## 2020-07-20 DIAGNOSIS — G4700 Insomnia, unspecified: Secondary | ICD-10-CM | POA: Diagnosis not present

## 2020-07-20 DIAGNOSIS — M62830 Muscle spasm of back: Secondary | ICD-10-CM | POA: Diagnosis not present

## 2020-07-20 DIAGNOSIS — M47817 Spondylosis without myelopathy or radiculopathy, lumbosacral region: Secondary | ICD-10-CM | POA: Diagnosis not present

## 2020-07-20 MED ORDER — METHYLPREDNISOLONE ACETATE 80 MG/ML IJ SUSP
80.0000 mg | Freq: Once | INTRAMUSCULAR | Status: AC
  Administered 2020-07-20: 80 mg via INTRAMUSCULAR

## 2020-07-20 NOTE — Progress Notes (Signed)
Established Patient Office Visit  Subjective:  Patient ID: Jesse Hanna, male    DOB: [birth year]  Age: 54 y.o. MRN: [id number]  CC:  Chief Complaint  Patient presents with  . Edema    lips    HPI Jesse Hanna presents for edema involving the lips.  He states he woke up this morning with his lower lip swollen.  He initially thought he had bitten his lip.  There were no signs of trauma.  As the day unfolded he has had some swelling of the right upper lip as well.  No tongue swelling.  No dyspnea.  No similar history of lip swelling.  He has not noted any swelling of the hands or feet.  No urticaria.  No itching.  No dyspnea.  His symptoms have not progressed throughout the day.  Recent history is that he had back surgery 8 days ago on the 15th for lumbar fusion.  He apparently developed some type of chin rash and was placed on Keflex for that and is finishing that up at this time.  He has no known food allergies.  He does take lisinopril for hypertension and apparently has been on this for several years.  Past Medical History:  Diagnosis Date  . Anxiety   . Chronic pain disorder    sees Dr. Corinna Capra   . GERD (gastroesophageal reflux disease)   . Hypertension   . Skin cancer     Past Surgical History:  Procedure Laterality Date  . CARPAL TUNNEL RELEASE  02/06/2012   Procedure: CARPAL TUNNEL RELEASE;  Surgeon: Hessie Dibble, MD;  Location: [place];  Service: Orthopedics;  Laterality: Left;  . COLONOSCOPY  01 25 2005   normal per Dr. Henrene Pastor  . CORONARY ANGIOPLASTY  2003   per Dr. Melvern Banker, normal-not had to go back  . epidural steroid shots     to thoracic spine  . ESOPHAGOGASTRODUODENOSCOPY  01 05 2005   per Dr. Henrene Pastor showed  Jerrye Bushy only  . HERNIA REPAIR     right x 2  . left foot neuroma    . ROTATOR CUFF REPAIR     left and right  . SPINAL FUSION     x3 on 12/15/07 on C6-7 and repeat on 05/16/10 then on L5-S1 on 08/23/09 per Dr. Patrice Paradise  .  SPINAL FUSION  12-08-13   from T3 to L3 per Dr. Rennis Harding     Family History  Problem Relation Age of Onset  . Hypertension Other   . Cancer Father        oral  . Tuberculosis Father   . Colon polyps Mother     Social History   Socioeconomic History  . Marital status: Married    Spouse name: Not on file  . Number of children: 1  . Years of education: Not on file  . Highest education level: Not on file  Occupational History    Employer: [employer]  Tobacco Use  . Smoking status: Current Every Day Smoker    Packs/day: 0.50    Years: 15.00    Pack years: 7.50    Types: Cigarettes  . Smokeless tobacco: Never Used  Vaping Use  . Vaping Use: Never used  Substance and Sexual Activity  . Alcohol use: No    Alcohol/week: 0.0 standard drinks  . Drug use: No  . Sexual activity: Not on file  Other Topics Concern  . Not on file  Social History  Narrative   Married         Social Determinants of Radio broadcast assistant Strain:   . Difficulty of Paying Living Expenses: Not on file  Food Insecurity:   . Worried About Charity fundraiser in the Last Year: Not on file  . Ran Out of Food in the Last Year: Not on file  Transportation Needs:   . Lack of Transportation (Medical): Not on file  . Lack of Transportation (Non-Medical): Not on file  Physical Activity:   . Days of Exercise per Week: Not on file  . Minutes of Exercise per Session: Not on file  Stress:   . Feeling of Stress : Not on file  Social Connections:   . Frequency of Communication with Friends and Family: Not on file  . Frequency of Social Gatherings with Friends and Family: Not on file  . Attends Religious Services: Not on file  . Active Member of Clubs or Organizations: Not on file  . Attends Archivist Meetings: Not on file  . Marital Status: Not on file  Intimate Partner Violence:   . Fear of Current or Ex-Partner: Not on file  . Emotionally Abused: Not on file  . Physically  Abused: Not on file  . Sexually Abused: Not on file    Outpatient Medications Prior to Visit  Medication Sig Dispense Refill  . amitriptyline (ELAVIL) 75 MG tablet Take 75 mg by mouth at bedtime.    Marland Kitchen aspirin 81 MG chewable tablet Chew by mouth daily.    . clonazePAM (KLONOPIN) 1 MG tablet TAKE 1 TABLET(1 MG) BY MOUTH THREE TIMES DAILY AS NEEDED FOR ANXIETY 90 tablet 0  . gabapentin (NEURONTIN) 300 MG capsule Take 600 mg by mouth at bedtime.  2  . lisinopril (ZESTRIL) 10 MG tablet TAKE 1 TABLET(10 MG) BY MOUTH DAILY 90 tablet 3  . methocarbamol (ROBAXIN) 750 MG tablet Take 750 mg by mouth 4 (four) times daily.     Marland Kitchen morphine (MS CONTIN) 60 MG 12 hr tablet Take 60 mg by mouth every 8 (eight) hours.  0  . oxyCODONE-acetaminophen (PERCOCET) 10-325 MG per tablet Take 1-3 tablets by mouth daily as needed for pain.     . pantoprazole (PROTONIX) 40 MG tablet TAKE 1 TABLET BY MOUTH EVERY DAY 90 tablet 1  . amoxicillin-clavulanate (AUGMENTIN) 875-125 MG tablet Take 1 tablet by mouth 2 (two) times daily. 20 tablet 0  . amoxicillin-clavulanate (AUGMENTIN) 875-125 MG tablet Take 1 tablet by mouth 2 (two) times daily. 20 tablet 0  . azithromycin (ZITHROMAX) 250 MG tablet As directed 6 tablet 0   No facility-administered medications prior to visit.    Allergies  Allergen Reactions  . Bupropion Hcl     REACTION: unspecified  . Etodolac   . Hydrocodone-Homatropine     Cough syrup  . Perindopril Erbumine     REACTION: unspecified  . Dilaudid [Hydromorphone Hcl] Other (See Comments)    Dizzy and burning on the skin  . Morphine And Related Nausea Only    GI upset    ROS Review of Systems  Constitutional: Negative for chills and fever.  HENT: Negative for trouble swallowing.   Respiratory: Negative for cough and shortness of breath.   Cardiovascular: Negative for chest pain, palpitations and leg swelling.  Skin: Negative for rash.  Neurological: Negative for dizziness.      Objective:      Physical Exam Vitals reviewed.  Constitutional:  Appearance: Normal appearance.  HENT:     Mouth/Throat:     Comments: He has some obvious swelling of the lower lip diffusely and upper lip as well right side greater than left.  No tongue edema.  Posterior pharynx is clear. Cardiovascular:     Rate and Rhythm: Normal rate and regular rhythm.  Pulmonary:     Effort: Pulmonary effort is normal.     Breath sounds: Normal breath sounds.  Musculoskeletal:     Right lower leg: No edema.     Left lower leg: No edema.  Skin:    Findings: No rash.  Neurological:     Mental Status: He is alert.     BP 110/60 (BP Location: Left Arm, Patient Position: Sitting, Cuff Size: Large)   Pulse 93   Temp 98.2 F (36.8 C) (Oral)   Wt 223 lb 8 oz (101.4 kg)   SpO2 99%   BMI 31.84 kg/m  Wt Readings from Last 3 Encounters:  07/20/20 223 lb 8 oz (101.4 kg)  02/24/20 214 lb 12.8 oz (97.4 kg)  12/05/19 211 lb 3.2 oz (95.8 kg)     Health Maintenance Due  Topic Date Due  . Hepatitis C Screening  Never done  . HIV Screening  Never done  . TETANUS/TDAP  Never done  . COLONOSCOPY  Never done  . INFLUENZA VACCINE  03/28/2020    There are no preventive care reminders to display for this patient.  Lab Results  Component Value Date   TSH 2.03 02/26/2017   Lab Results  Component Value Date   WBC 8.3 03/18/2018   HGB 14.2 03/18/2018   HCT 43.2 03/18/2018   MCV 89.8 03/18/2018   PLT 264 03/18/2018   Lab Results  Component Value Date   NA 141 03/18/2018   K 4.4 03/18/2018   CO2 30 03/18/2018   GLUCOSE 94 03/18/2018   BUN 10 03/18/2018   CREATININE 0.64 03/18/2018   BILITOT 0.3 02/26/2017   ALKPHOS 55 02/26/2017   AST 18 02/26/2017   ALT 14 02/26/2017   PROT 6.9 02/26/2017   ALBUMIN 4.2 02/26/2017   CALCIUM 9.1 03/18/2018   ANIONGAP 8 03/18/2018   GFR 99.44 02/26/2017   Lab Results  Component Value Date   CHOL 214 (H) 02/26/2017   Lab Results  Component Value Date   HDL  35.20 (L) 02/26/2017   Lab Results  Component Value Date   LDLCALC 143 (H) 02/26/2017   Lab Results  Component Value Date   TRIG 178.0 (H) 02/26/2017   Lab Results  Component Value Date   CHOLHDL 6 02/26/2017   No results found for: HGBA1C    Assessment & Plan:   Patient presents with onset this morning of angioedema type symptoms involving the lips.  He is on chronic ACE inhibitor use and this will need to be discontinued.  Only new medication is cephalexin.  He has no known food allergies.  -Hold lisinopril at this time -Recommend antihistamine such as Benadryl -We will check of his surgeon before prescribing any prednisone.  Discussed with his surgeon, Dr Patrice Paradise, who is in agreement to use steroids and Depo-Medrol 80 mg IM given -He knows to call 911 or go to ER immediately for any progressive upper respiratory swelling or dyspnea -Recommend follow-up in 2 weeks with Dr. Sarajane Jews to reassess blood pressure and discuss other potential blood pressure medication options -Consider further work-up regarding etiology of angioedema if he has recurrent episodes  No orders of  the defined types were placed in this encounter.   Follow-up: No follow-ups on file.    Carolann Littler, MD

## 2020-07-20 NOTE — Patient Instructions (Signed)
Angioedema Angioedema is the sudden swelling of tissue in the body. Angioedema can affect any part of the body, but it most often affects the deeper parts of the skin, causing red, itchy patches (hives) to appear over the affected area. It often begins during the night and is found in the morning. Depending on the cause, angioedema may happen:  Only once.  Several times. It may come back in unpredictable patterns.  Repeatedly for several years. Over time, it may gradually stop coming back. Angioedema can be life-threatening if it affects the air passages that you breathe through. What are the causes? This condition may be caused by:  Foods, such as milk, eggs, shellfish, wheat, or nuts.  Certain medicines, such as ACE inhibitors, antibiotics, nonsteroidal anti-inflammatory drugs, birth control pills, or dyes used in X-rays.  Insect stings.  Infections. Angioedema can be inherited, and episodes can be triggered by:  Mild injury.  Dental work.  Surgery.  Stress.  Sudden changes in temperature.  Exercise. In some cases, the cause of this condition is not known. What are the signs or symptoms? Symptoms of this condition depend on where the swelling happens. Symptoms may include:  Swollen skin.  Red, itchy patches of skin (hives).  Redness in the affected area.  Pain in the affected area.  Swollen lips or tongue.  Wheezing.  Breathing problems.  If your internal organs are involved, symptoms may also include:  Nausea.  Abdominal pain.  Vomiting.  Difficulty swallowing.  Difficulty passing urine. How is this diagnosed? This condition may be diagnosed based on:  An exam of the affected area.  Your medical history.  Whether anyone in your family has had this condition before.  A review of any medicines you have been taking.  Tests, including: ? Allergy skin tests to see if the condition was caused by an allergic reaction. ? Blood tests to see if the  condition was caused by a gene. ? Tests to check for underlying diseases that could cause the condition. How is this treated? Treatment for this condition depends on the cause. It may involve any of the following:  If something triggered the condition, making changes to keep it from triggering the condition again.  If the condition affects your breathing, having tubes placed in your airway to keep it open.  Taking medicines to treat symptoms or prevent future episodes. These may include: ? Antihistamines. ? Epinephrine injections. ? Steroids. If your condition is severe, you may need to be treated at the hospital. Angioedema usually gets better in 24-48 hours. Follow these instructions at home:  Take over-the-counter and prescription medicines only as told by your health care provider.  If you were given medicines for emergency allergy treatment, always carry them with you.  Wear a medical bracelet as told by your health care provider.  If something triggers your condition, avoid the trigger, if possible.  If your condition is inherited and you are thinking about having children, talk to your health care provider. It is important to discuss the risks of passing on the condition to your children. Contact a health care provider if:  You have repeated episodes of angioedema.  Episodes of angioedema start to happen more often than they used to, even after you take steps to prevent them.  You have episodes of angioedema that are more severe than they have been before, even after you take steps to prevent them.  You are thinking about having children. Get help right away if:  You  have severe swelling of your mouth, tongue, or lips.  You have trouble breathing.  You have trouble swallowing.  You faint. This information is not intended to replace advice given to you by your health care provider. Make sure you discuss any questions you have with your health care provider. Document  Revised: 07/27/2017 Document Reviewed: 02/22/2016 Elsevier Patient Education  [place] the Lisinopril  HOLD the Cephalexin  Take over-the-counter Benadryl 25 to 50 mg every 6 hours until lip edema resolves  Follow-up immediately for any recurrent lip swelling, generalized hives, shortness of breath, or other concerns  Set up follow-up with Dr. Sarajane Jews to reassess blood pressure within a couple of weeks off the lisinopril

## 2020-07-21 DIAGNOSIS — M4726 Other spondylosis with radiculopathy, lumbar region: Secondary | ICD-10-CM | POA: Diagnosis not present

## 2020-07-21 DIAGNOSIS — M960 Pseudarthrosis after fusion or arthrodesis: Secondary | ICD-10-CM | POA: Diagnosis not present

## 2020-07-21 DIAGNOSIS — T84216D Breakdown (mechanical) of internal fixation device of vertebrae, subsequent encounter: Secondary | ICD-10-CM | POA: Diagnosis not present

## 2020-07-21 DIAGNOSIS — M961 Postlaminectomy syndrome, not elsewhere classified: Secondary | ICD-10-CM | POA: Diagnosis not present

## 2020-07-21 DIAGNOSIS — M4716 Other spondylosis with myelopathy, lumbar region: Secondary | ICD-10-CM | POA: Diagnosis not present

## 2020-07-21 DIAGNOSIS — M48061 Spinal stenosis, lumbar region without neurogenic claudication: Secondary | ICD-10-CM | POA: Diagnosis not present

## 2020-07-26 DIAGNOSIS — M4726 Other spondylosis with radiculopathy, lumbar region: Secondary | ICD-10-CM | POA: Diagnosis not present

## 2020-07-26 DIAGNOSIS — M4716 Other spondylosis with myelopathy, lumbar region: Secondary | ICD-10-CM | POA: Diagnosis not present

## 2020-07-26 DIAGNOSIS — T84216D Breakdown (mechanical) of internal fixation device of vertebrae, subsequent encounter: Secondary | ICD-10-CM | POA: Diagnosis not present

## 2020-07-26 DIAGNOSIS — M960 Pseudarthrosis after fusion or arthrodesis: Secondary | ICD-10-CM | POA: Diagnosis not present

## 2020-07-26 DIAGNOSIS — M961 Postlaminectomy syndrome, not elsewhere classified: Secondary | ICD-10-CM | POA: Diagnosis not present

## 2020-07-26 DIAGNOSIS — M48061 Spinal stenosis, lumbar region without neurogenic claudication: Secondary | ICD-10-CM | POA: Diagnosis not present

## 2020-07-28 DIAGNOSIS — T84216D Breakdown (mechanical) of internal fixation device of vertebrae, subsequent encounter: Secondary | ICD-10-CM | POA: Diagnosis not present

## 2020-07-28 DIAGNOSIS — M960 Pseudarthrosis after fusion or arthrodesis: Secondary | ICD-10-CM | POA: Diagnosis not present

## 2020-07-28 DIAGNOSIS — M48061 Spinal stenosis, lumbar region without neurogenic claudication: Secondary | ICD-10-CM | POA: Diagnosis not present

## 2020-07-28 DIAGNOSIS — M961 Postlaminectomy syndrome, not elsewhere classified: Secondary | ICD-10-CM | POA: Diagnosis not present

## 2020-07-29 DIAGNOSIS — M960 Pseudarthrosis after fusion or arthrodesis: Secondary | ICD-10-CM | POA: Diagnosis not present

## 2020-07-29 DIAGNOSIS — M4726 Other spondylosis with radiculopathy, lumbar region: Secondary | ICD-10-CM | POA: Diagnosis not present

## 2020-07-29 DIAGNOSIS — M48061 Spinal stenosis, lumbar region without neurogenic claudication: Secondary | ICD-10-CM | POA: Diagnosis not present

## 2020-07-29 DIAGNOSIS — M961 Postlaminectomy syndrome, not elsewhere classified: Secondary | ICD-10-CM | POA: Diagnosis not present

## 2020-07-29 DIAGNOSIS — T84216D Breakdown (mechanical) of internal fixation device of vertebrae, subsequent encounter: Secondary | ICD-10-CM | POA: Diagnosis not present

## 2020-07-29 DIAGNOSIS — M4716 Other spondylosis with myelopathy, lumbar region: Secondary | ICD-10-CM | POA: Diagnosis not present

## 2020-07-30 ENCOUNTER — Telehealth: Payer: Self-pay | Admitting: Family Medicine

## 2020-07-30 ENCOUNTER — Other Ambulatory Visit: Payer: Self-pay | Admitting: Family Medicine

## 2020-07-30 MED ORDER — AMLODIPINE BESYLATE 5 MG PO TABS
5.0000 mg | ORAL_TABLET | Freq: Every day | ORAL | 1 refills | Status: DC
Start: 2020-07-30 — End: 2020-07-30

## 2020-07-30 NOTE — Telephone Encounter (Signed)
I spoke with pt, he is aware of message below. Rx sent in.

## 2020-07-30 NOTE — Addendum Note (Signed)
Addended by: Rodrigo Ran on: 07/30/2020 02:27 PM   Modules accepted: Orders

## 2020-07-30 NOTE — Telephone Encounter (Signed)
The patient seen Dr. Elease Hashimoto on 11/23 and he had a drug reaction to the High BP pills and the antibiotic.   He recently had back surgery and now doing PT and the Physical Therapist is wanting to know when the patient can go back on his High BP medication.  He would like a call back today since his BP is rising again.  Please advise

## 2020-07-30 NOTE — Telephone Encounter (Signed)
I would not recommend that he ever get back on the lisinopril because of possible angioedema reaction.  Recommend start amlodipine 5 mg once daily and set up follow-up with Dr. Sarajane Jews in the next few weeks to reassess

## 2020-08-03 DIAGNOSIS — M960 Pseudarthrosis after fusion or arthrodesis: Secondary | ICD-10-CM | POA: Diagnosis not present

## 2020-08-03 DIAGNOSIS — M4716 Other spondylosis with myelopathy, lumbar region: Secondary | ICD-10-CM | POA: Diagnosis not present

## 2020-08-03 DIAGNOSIS — T84216D Breakdown (mechanical) of internal fixation device of vertebrae, subsequent encounter: Secondary | ICD-10-CM | POA: Diagnosis not present

## 2020-08-03 DIAGNOSIS — M48061 Spinal stenosis, lumbar region without neurogenic claudication: Secondary | ICD-10-CM | POA: Diagnosis not present

## 2020-08-03 DIAGNOSIS — M961 Postlaminectomy syndrome, not elsewhere classified: Secondary | ICD-10-CM | POA: Diagnosis not present

## 2020-08-03 DIAGNOSIS — M4726 Other spondylosis with radiculopathy, lumbar region: Secondary | ICD-10-CM | POA: Diagnosis not present

## 2020-08-06 DIAGNOSIS — M960 Pseudarthrosis after fusion or arthrodesis: Secondary | ICD-10-CM | POA: Diagnosis not present

## 2020-08-06 DIAGNOSIS — M48061 Spinal stenosis, lumbar region without neurogenic claudication: Secondary | ICD-10-CM | POA: Diagnosis not present

## 2020-08-06 DIAGNOSIS — M4726 Other spondylosis with radiculopathy, lumbar region: Secondary | ICD-10-CM | POA: Diagnosis not present

## 2020-08-06 DIAGNOSIS — T84216D Breakdown (mechanical) of internal fixation device of vertebrae, subsequent encounter: Secondary | ICD-10-CM | POA: Diagnosis not present

## 2020-08-06 DIAGNOSIS — M961 Postlaminectomy syndrome, not elsewhere classified: Secondary | ICD-10-CM | POA: Diagnosis not present

## 2020-08-06 DIAGNOSIS — M4324 Fusion of spine, thoracic region: Secondary | ICD-10-CM | POA: Diagnosis not present

## 2020-08-06 DIAGNOSIS — M4716 Other spondylosis with myelopathy, lumbar region: Secondary | ICD-10-CM | POA: Diagnosis not present

## 2020-08-07 ENCOUNTER — Other Ambulatory Visit: Payer: Self-pay | Admitting: Family Medicine

## 2020-08-09 NOTE — Telephone Encounter (Signed)
I would not recommend that he ever get back on the lisinopril because of possible angioedema reaction.  Recommend start amlodipine 5 mg once daily and set up follow-up with Dr. Sarajane Jews in the next few weeks to reassess

## 2020-08-10 DIAGNOSIS — M960 Pseudarthrosis after fusion or arthrodesis: Secondary | ICD-10-CM | POA: Diagnosis not present

## 2020-08-10 DIAGNOSIS — M48061 Spinal stenosis, lumbar region without neurogenic claudication: Secondary | ICD-10-CM | POA: Diagnosis not present

## 2020-08-10 DIAGNOSIS — M961 Postlaminectomy syndrome, not elsewhere classified: Secondary | ICD-10-CM | POA: Diagnosis not present

## 2020-08-10 DIAGNOSIS — M4726 Other spondylosis with radiculopathy, lumbar region: Secondary | ICD-10-CM | POA: Diagnosis not present

## 2020-08-10 DIAGNOSIS — M4716 Other spondylosis with myelopathy, lumbar region: Secondary | ICD-10-CM | POA: Diagnosis not present

## 2020-08-10 DIAGNOSIS — T84216D Breakdown (mechanical) of internal fixation device of vertebrae, subsequent encounter: Secondary | ICD-10-CM | POA: Diagnosis not present

## 2020-08-11 ENCOUNTER — Telehealth: Payer: Self-pay | Admitting: Family Medicine

## 2020-08-11 NOTE — Telephone Encounter (Signed)
Left message for patient to call back and schedule Medicare Annual Wellness Visit (AWV) either virtually or in office.   Last AWV no information please schedule at anytime with [facility] Nurse Health Advisor 1 or 2   This should be a 45 minute visit. 

## 2020-08-12 DIAGNOSIS — M4726 Other spondylosis with radiculopathy, lumbar region: Secondary | ICD-10-CM | POA: Diagnosis not present

## 2020-08-12 DIAGNOSIS — M48061 Spinal stenosis, lumbar region without neurogenic claudication: Secondary | ICD-10-CM | POA: Diagnosis not present

## 2020-08-12 DIAGNOSIS — M4716 Other spondylosis with myelopathy, lumbar region: Secondary | ICD-10-CM | POA: Diagnosis not present

## 2020-08-12 DIAGNOSIS — M961 Postlaminectomy syndrome, not elsewhere classified: Secondary | ICD-10-CM | POA: Diagnosis not present

## 2020-08-12 DIAGNOSIS — T84216D Breakdown (mechanical) of internal fixation device of vertebrae, subsequent encounter: Secondary | ICD-10-CM | POA: Diagnosis not present

## 2020-08-12 DIAGNOSIS — M960 Pseudarthrosis after fusion or arthrodesis: Secondary | ICD-10-CM | POA: Diagnosis not present

## 2020-08-14 DIAGNOSIS — M960 Pseudarthrosis after fusion or arthrodesis: Secondary | ICD-10-CM | POA: Diagnosis not present

## 2020-08-14 DIAGNOSIS — D509 Iron deficiency anemia, unspecified: Secondary | ICD-10-CM | POA: Diagnosis not present

## 2020-08-14 DIAGNOSIS — M961 Postlaminectomy syndrome, not elsewhere classified: Secondary | ICD-10-CM | POA: Diagnosis not present

## 2020-08-14 DIAGNOSIS — G894 Chronic pain syndrome: Secondary | ICD-10-CM | POA: Diagnosis not present

## 2020-08-14 DIAGNOSIS — F17210 Nicotine dependence, cigarettes, uncomplicated: Secondary | ICD-10-CM | POA: Diagnosis not present

## 2020-08-14 DIAGNOSIS — M419 Scoliosis, unspecified: Secondary | ICD-10-CM | POA: Diagnosis not present

## 2020-08-14 DIAGNOSIS — Z79891 Long term (current) use of opiate analgesic: Secondary | ICD-10-CM | POA: Diagnosis not present

## 2020-08-14 DIAGNOSIS — I10 Essential (primary) hypertension: Secondary | ICD-10-CM | POA: Diagnosis not present

## 2020-08-14 DIAGNOSIS — M4726 Other spondylosis with radiculopathy, lumbar region: Secondary | ICD-10-CM | POA: Diagnosis not present

## 2020-08-14 DIAGNOSIS — M48061 Spinal stenosis, lumbar region without neurogenic claudication: Secondary | ICD-10-CM | POA: Diagnosis not present

## 2020-08-14 DIAGNOSIS — M4716 Other spondylosis with myelopathy, lumbar region: Secondary | ICD-10-CM | POA: Diagnosis not present

## 2020-08-14 DIAGNOSIS — G5600 Carpal tunnel syndrome, unspecified upper limb: Secondary | ICD-10-CM | POA: Diagnosis not present

## 2020-08-14 DIAGNOSIS — F411 Generalized anxiety disorder: Secondary | ICD-10-CM | POA: Diagnosis not present

## 2020-08-14 DIAGNOSIS — Z86718 Personal history of other venous thrombosis and embolism: Secondary | ICD-10-CM | POA: Diagnosis not present

## 2020-08-14 DIAGNOSIS — M5134 Other intervertebral disc degeneration, thoracic region: Secondary | ICD-10-CM | POA: Diagnosis not present

## 2020-08-14 DIAGNOSIS — T84216D Breakdown (mechanical) of internal fixation device of vertebrae, subsequent encounter: Secondary | ICD-10-CM | POA: Diagnosis not present

## 2020-08-14 DIAGNOSIS — Z7982 Long term (current) use of aspirin: Secondary | ICD-10-CM | POA: Diagnosis not present

## 2020-08-14 DIAGNOSIS — Z9181 History of falling: Secondary | ICD-10-CM | POA: Diagnosis not present

## 2020-08-17 DIAGNOSIS — G894 Chronic pain syndrome: Secondary | ICD-10-CM | POA: Diagnosis not present

## 2020-08-17 DIAGNOSIS — G4700 Insomnia, unspecified: Secondary | ICD-10-CM | POA: Diagnosis not present

## 2020-08-17 DIAGNOSIS — M47817 Spondylosis without myelopathy or radiculopathy, lumbosacral region: Secondary | ICD-10-CM | POA: Diagnosis not present

## 2020-08-17 DIAGNOSIS — M62830 Muscle spasm of back: Secondary | ICD-10-CM | POA: Diagnosis not present

## 2020-08-19 ENCOUNTER — Ambulatory Visit: Payer: Medicare Other

## 2020-08-19 NOTE — Progress Notes (Deleted)
Subjective:   Jesse Hanna is a 54 y.o. male who presents for an Initial Medicare Annual Wellness Visit.  Review of Systems    N/A        Objective:    There were no vitals filed for this visit. There is no height or weight on file to calculate BMI.  Advanced Directives 03/18/2018 02/01/2012  Does Patient Have a Medical Advance Directive? No Patient does not have advance directive    Current Medications (verified) Outpatient Encounter Medications as of 08/19/2020  Medication Sig  . amitriptyline (ELAVIL) 75 MG tablet Take 75 mg by mouth at bedtime.  Marland Kitchen amLODipine (NORVASC) 5 MG tablet TAKE 1 TABLET(5 MG) BY MOUTH DAILY  . aspirin 81 MG chewable tablet Chew by mouth daily.  . clonazePAM (KLONOPIN) 1 MG tablet TAKE 1 TABLET(1 MG) BY MOUTH THREE TIMES DAILY AS NEEDED FOR ANXIETY  . gabapentin (NEURONTIN) 300 MG capsule Take 600 mg by mouth at bedtime.  . methocarbamol (ROBAXIN) 750 MG tablet Take 750 mg by mouth 4 (four) times daily.   Marland Kitchen morphine (MS CONTIN) 60 MG 12 hr tablet Take 60 mg by mouth every 8 (eight) hours.  Marland Kitchen oxyCODONE-acetaminophen (PERCOCET) 10-325 MG per tablet Take 1-3 tablets by mouth daily as needed for pain.   . pantoprazole (PROTONIX) 40 MG tablet TAKE 1 TABLET BY MOUTH EVERY DAY   No facility-administered encounter medications on file as of 08/19/2020.    Allergies (verified) Bupropion hcl, Etodolac, Hydrocodone-homatropine, Perindopril erbumine, Dilaudid [hydromorphone hcl], and Morphine and related   History: Past Medical History:  Diagnosis Date  . Anxiety   . Chronic pain disorder    sees Dr. Corinna Capra   . GERD (gastroesophageal reflux disease)   . Hypertension   . Skin cancer    Past Surgical History:  Procedure Laterality Date  . CARPAL TUNNEL RELEASE  02/06/2012   Procedure: CARPAL TUNNEL RELEASE;  Surgeon: Hessie Dibble, MD;  Location: [place] [facility];  Service: Orthopedics;  Laterality: Left;  . COLONOSCOPY  01 25 2005    normal per Dr. Henrene Pastor  . CORONARY ANGIOPLASTY  2003   per Dr. Melvern Banker, normal-not had to go back  . epidural steroid shots     to thoracic spine  . ESOPHAGOGASTRODUODENOSCOPY  01 05 2005   per Dr. Henrene Pastor showed  Jerrye Bushy only  . HERNIA REPAIR     right x 2  . left foot neuroma    . ROTATOR CUFF REPAIR     left and right  . SPINAL FUSION     x3 on 12/15/07 on C6-7 and repeat on 05/16/10 then on L5-S1 on 08/23/09 per Dr. Patrice Paradise  . SPINAL FUSION  12-08-13   from T3 to L3 per Dr. Rennis Harding    Family History  Problem Relation Age of Onset  . Hypertension Other   . Cancer Father        oral  . Tuberculosis Father   . Colon polyps Mother    Social History   Socioeconomic History  . Marital status: Married    Spouse name: Not on file  . Number of children: 1  . Years of education: Not on file  . Highest education level: Not on file  Occupational History    Employer: [employer]  Tobacco Use  . Smoking status: Current Every Day Smoker    Packs/day: 0.50    Years: 15.00    Pack years: 7.50    Types: Cigarettes  .  Smokeless tobacco: Never Used  Vaping Use  . Vaping Use: Never used  Substance and Sexual Activity  . Alcohol use: No    Alcohol/week: 0.0 standard drinks  . Drug use: No  . Sexual activity: Not on file  Other Topics Concern  . Not on file  Social History Narrative   Married         Social Determinants of Health   Financial Resource Strain: Not on file  Food Insecurity: Not on file  Transportation Needs: Not on file  Physical Activity: Not on file  Stress: Not on file  Social Connections: Not on file    Tobacco Counseling Ready to quit: Not Answered Counseling given: Not Answered   Clinical Intake:                 Diabetic?No          Activities of Daily Living No flowsheet data found.  Patient Care Team: Laurey Morale, MD as PCP - General  Indicate any recent Medical Services you may have received from other than [facility]  providers in the past year (date may be approximate).     Assessment:   This is a routine wellness examination for [facility].  Hearing/Vision screen No exam data present  Dietary issues and exercise activities discussed:    Goals   None    Depression Screen No flowsheet data found.  Fall Risk No flowsheet data found.  FALL RISK PREVENTION PERTAINING TO THE HOME:  Any stairs in or around the home? {YES/NO:21197} If so, are there any without handrails? No  Home free of loose throw rugs in walkways, pet beds, electrical cords, etc? Yes  Adequate lighting in your home to reduce risk of falls? Yes   ASSISTIVE DEVICES UTILIZED TO PREVENT FALLS:  Life alert? {YES/NO:21197} Use of a cane, walker or w/c? {YES/NO:21197} Grab bars in the bathroom? {YES/NO:21197} Shower chair or bench in shower? {YES/NO:21197} Elevated toilet seat or a handicapped toilet? {YES/NO:21197}    Cognitive Function:        Immunizations Immunization History  Administered Date(s) Administered  . Influenza Inj Mdck Quad Pf 05/07/2018  . Influenza Split 07/07/2013, 05/23/2015, 04/18/2017, 05/07/2018  . Influenza,inj,Quad PF,6+ Mos 07/07/2013  . Influenza-Unspecified 05/23/2015, 04/18/2017, 05/07/2018  . PFIZER SARS-COV-2 Vaccination 02/19/2020, 03/11/2020    TDAP status: Due, Education has been provided regarding the importance of this vaccine. Advised may receive this vaccine at local pharmacy or Health Dept. Aware to provide a copy of the vaccination record if obtained from local pharmacy or Health Dept. Verbalized acceptance and understanding.  {Flu Vaccine status:2101806}  Pneumococcal vaccine status: Up to date  Covid-19 vaccine status: Completed vaccines  Qualifies for Shingles Vaccine? Yes   Zostavax completed No   Shingrix Completed?: No.    Education has been provided regarding the importance of this vaccine. Patient has been advised to call insurance company to determine out of  pocket expense if they have not yet received this vaccine. Advised may also receive vaccine at local pharmacy or Health Dept. Verbalized acceptance and understanding.  Screening Tests Health Maintenance  Topic Date Due  . Hepatitis C Screening  Never done  . HIV Screening  Never done  . TETANUS/TDAP  Never done  . COLONOSCOPY  Never done  . INFLUENZA VACCINE  03/28/2020  . COVID-19 Vaccine (3 - Booster for Pfizer series) 09/11/2020    Health Maintenance  Health Maintenance Due  Topic Date Due  . Hepatitis C Screening  Never done  .  HIV Screening  Never done  . TETANUS/TDAP  Never done  . COLONOSCOPY  Never done  . INFLUENZA VACCINE  03/28/2020    {Colorectal cancer screening:2101809}  Lung Cancer Screening: (Low Dose CT Chest recommended if Age 55-80 years, 30 pack-year currently smoking OR have quit w/in 15years.) does not qualify.   Lung Cancer Screening Referral: N/A   Additional Screening:  Hepatitis C Screening: does qualify;   Vision Screening: Recommended annual ophthalmology exams for early detection of glaucoma and other disorders of the eye. Is the patient up to date with their annual eye exam?  {YES/NO:21197} Who is the provider or what is the name of the office in which the patient attends annual eye exams? *** If pt is not established with a provider, would they like to be referred to a provider to establish care? {YES/NO:21197}.   Dental Screening: Recommended annual dental exams for proper oral hygiene  Community Resource Referral / Chronic Care Management: CRR required this visit?  {YES/NO:21197}  CCM required this visit?  {YES/NO:21197}     Plan:     I have personally reviewed and noted the following in the patient's chart:   . Medical and social history . Use of alcohol, tobacco or illicit drugs  . Current medications and supplements . Functional ability and status . Nutritional status . Physical activity . Advanced directives . List of  other physicians . Hospitalizations, surgeries, and ER visits in previous 12 months . Vitals . Screenings to include cognitive, depression, and falls . Referrals and appointments  In addition, I have reviewed and discussed with patient certain preventive protocols, quality metrics, and best practice recommendations. A written personalized care plan for preventive services as well as general preventive health recommendations were provided to patient.     Ofilia Neas, LPN   [id number]   Nurse Notes: ***

## 2020-08-25 ENCOUNTER — Encounter: Payer: Self-pay | Admitting: Family Medicine

## 2020-08-25 ENCOUNTER — Ambulatory Visit (INDEPENDENT_AMBULATORY_CARE_PROVIDER_SITE_OTHER): Payer: Medicare Other | Admitting: Family Medicine

## 2020-08-25 ENCOUNTER — Other Ambulatory Visit: Payer: Self-pay

## 2020-08-25 VITALS — BP 118/76 | HR 68 | Temp 98.9°F | Ht 71.5 in | Wt 216.0 lb

## 2020-08-25 DIAGNOSIS — I10 Essential (primary) hypertension: Secondary | ICD-10-CM

## 2020-08-25 DIAGNOSIS — D1724 Benign lipomatous neoplasm of skin and subcutaneous tissue of left leg: Secondary | ICD-10-CM

## 2020-08-25 NOTE — Progress Notes (Signed)
   Subjective:    Patient ID: Jesse Hanna, male    DOB: [birth year], 54 y.o.   MRN: [id number]  HPI Here to check a painful knot on the left thigh that has been bothering him for several weeks. No chest pain or SOB. He had spinal surgery 6 weeks ago and his rehab has been difficult. He is worried about a possible blood clot since he has a hx of DVT in the left leg. He is also here to follow up from a bout of angioedema, presumably from Lisinopril. He was seen here on 07-20-20 for this, and the Lisinopril was stopped. He was started on Amlodipine, and he has done well since then.   Review of Systems  Constitutional: Negative.   HENT: Negative for facial swelling.   Respiratory: Negative.   Cardiovascular: Negative.   Musculoskeletal: Positive for myalgias.       Objective:   Physical Exam Constitutional:      Appearance: Normal appearance.     Comments: Wearing a lumbar support and walking with a cane   Cardiovascular:     Rate and Rhythm: Normal rate and regular rhythm.     Pulses: Normal pulses.     Heart sounds: Normal heart sounds.  Pulmonary:     Effort: Pulmonary effort is normal.     Breath sounds: Normal breath sounds.  Musculoskeletal:     Comments: There is a tender mobile knot on the left lateral thigh just under the skin  Neurological:     Mental Status: He is alert.           Assessment & Plan:  The knot is an inflamed lipoma, not a blood clot. We agreed to simply observe this for now. The angioedema has resolved, and his BP is stable on the new medication.  Gershon Crane, MD

## 2020-09-02 DIAGNOSIS — T84216D Breakdown (mechanical) of internal fixation device of vertebrae, subsequent encounter: Secondary | ICD-10-CM | POA: Diagnosis not present

## 2020-09-02 DIAGNOSIS — M48061 Spinal stenosis, lumbar region without neurogenic claudication: Secondary | ICD-10-CM | POA: Diagnosis not present

## 2020-09-02 DIAGNOSIS — M961 Postlaminectomy syndrome, not elsewhere classified: Secondary | ICD-10-CM | POA: Diagnosis not present

## 2020-09-02 DIAGNOSIS — M960 Pseudarthrosis after fusion or arthrodesis: Secondary | ICD-10-CM | POA: Diagnosis not present

## 2020-09-02 DIAGNOSIS — M4726 Other spondylosis with radiculopathy, lumbar region: Secondary | ICD-10-CM | POA: Diagnosis not present

## 2020-09-02 DIAGNOSIS — M4716 Other spondylosis with myelopathy, lumbar region: Secondary | ICD-10-CM | POA: Diagnosis not present

## 2020-09-08 ENCOUNTER — Other Ambulatory Visit: Payer: Self-pay

## 2020-09-08 ENCOUNTER — Ambulatory Visit: Payer: Medicare Other

## 2020-09-08 NOTE — Progress Notes (Deleted)
Subjective:   Jesse Hanna is a 55 y.o. male who presents for an Initial Medicare Annual Wellness Visit.  I connected with *** today by telephone and verified that I am speaking with the correct person using two identifiers. Location patient: home Location provider: work Persons participating in the virtual visit: patient, provider.   I discussed the limitations, risks, security and privacy concerns of performing an evaluation and management service by telephone and the availability of in person appointments. I also discussed with the patient that there may be a patient responsible charge related to this service. The patient expressed understanding and verbally consented to this telephonic visit.    Interactive audio and video telecommunications were attempted between this provider and patient, however failed, due to patient having technical difficulties OR patient did not have access to video capability.  We continued and completed visit with audio only.     Review of Systems    N/A        Objective:    There were no vitals filed for this visit. There is no height or weight on file to calculate BMI.  Advanced Directives 03/18/2018 02/01/2012  Does Patient Have a Medical Advance Directive? No Patient does not have advance directive    Current Medications (verified) Outpatient Encounter Medications as of 09/08/2020  Medication Sig  . amitriptyline (ELAVIL) 75 MG tablet Take 75 mg by mouth at bedtime.  Marland Kitchen amLODipine (NORVASC) 5 MG tablet TAKE 1 TABLET(5 MG) BY MOUTH DAILY  . aspirin 81 MG chewable tablet Chew by mouth daily.  . clonazePAM (KLONOPIN) 1 MG tablet TAKE 1 TABLET(1 MG) BY MOUTH THREE TIMES DAILY AS NEEDED FOR ANXIETY  . gabapentin (NEURONTIN) 300 MG capsule Take 600 mg by mouth at bedtime.  . methocarbamol (ROBAXIN) 750 MG tablet Take 750 mg by mouth 4 (four) times daily.  Marland Kitchen morphine (MS CONTIN) 60 MG 12 hr tablet Take 60 mg by mouth every 8 (eight) hours.  Marland Kitchen  oxyCODONE-acetaminophen (PERCOCET) 10-325 MG per tablet Take 1-3 tablets by mouth daily as needed for pain.   . pantoprazole (PROTONIX) 40 MG tablet TAKE 1 TABLET BY MOUTH EVERY DAY   No facility-administered encounter medications on file as of 09/08/2020.    Allergies (verified) Bupropion hcl, Etodolac, Hydrocodone-homatropine, Lisinopril, Perindopril erbumine, Dilaudid [hydromorphone hcl], and Morphine and related   History: Past Medical History:  Diagnosis Date  . Anxiety   . Chronic pain disorder    sees Dr. Corinna Capra   . GERD (gastroesophageal reflux disease)   . Hypertension   . Skin cancer    Past Surgical History:  Procedure Laterality Date  . CARPAL TUNNEL RELEASE  02/06/2012   Procedure: CARPAL TUNNEL RELEASE;  Surgeon: Hessie Dibble, MD;  Location: [place] [facility];  Service: Orthopedics;  Laterality: Left;  . COLONOSCOPY  01 25 2005   normal per Dr. Henrene Pastor  . CORONARY ANGIOPLASTY  2003   per Dr. Melvern Banker, normal-not had to go back  . epidural steroid shots     to thoracic spine  . ESOPHAGOGASTRODUODENOSCOPY  01 05 2005   per Dr. Henrene Pastor showed  Jerrye Bushy only  . HERNIA REPAIR     right x 2  . left foot neuroma    . ROTATOR CUFF REPAIR     left and right  . SPINAL FUSION     x3 on 12/15/07 on C6-7 and repeat on 05/16/10 then on L5-S1 on 08/23/09 per Dr. Patrice Paradise  . SPINAL FUSION  12-08-13   from T3 to L3 per Dr. Rennis Harding    Family History  Problem Relation Age of Onset  . Hypertension Other   . Cancer Father        oral  . Tuberculosis Father   . Colon polyps Mother    Social History   Socioeconomic History  . Marital status: Married    Spouse name: Not on file  . Number of children: 1  . Years of education: Not on file  . Highest education level: Not on file  Occupational History    Employer: [employer]  Tobacco Use  . Smoking status: Current Every Day Smoker    Packs/day: 0.50    Years: 15.00    Pack years: 7.50    Types: Cigarettes   . Smokeless tobacco: Never Used  Vaping Use  . Vaping Use: Never used  Substance and Sexual Activity  . Alcohol use: No    Alcohol/week: 0.0 standard drinks  . Drug use: No  . Sexual activity: Not on file  Other Topics Concern  . Not on file  Social History Narrative   Married         Social Determinants of Health   Financial Resource Strain: Not on file  Food Insecurity: Not on file  Transportation Needs: Not on file  Physical Activity: Not on file  Stress: Not on file  Social Connections: Not on file    Tobacco Counseling Ready to quit: Not Answered Counseling given: Not Answered   Clinical Intake:                 Diabetic?No          Activities of Daily Living No flowsheet data found.  Patient Care Team: Laurey Morale, MD as PCP - General  Indicate any recent Medical Services you may have received from other than [facility] providers in the past year (date may be approximate).     Assessment:   This is a routine wellness examination for [facility].  Hearing/Vision screen No exam data present  Dietary issues and exercise activities discussed:    Goals   None    Depression Screen PHQ 2/9 Scores 08/25/2020  PHQ - 2 Score 2  PHQ- 9 Score 8    Fall Risk No flowsheet data found.  FALL RISK PREVENTION PERTAINING TO THE HOME:  Any stairs in or around the home? {YES/NO:21197} If so, are there any without handrails? No  Home free of loose throw rugs in walkways, pet beds, electrical cords, etc? Yes  Adequate lighting in your home to reduce risk of falls? Yes   ASSISTIVE DEVICES UTILIZED TO PREVENT FALLS:  Life alert? {YES/NO:21197} Use of a cane, walker or w/c? {YES/NO:21197} Grab bars in the bathroom? {YES/NO:21197} Shower chair or bench in shower? {YES/NO:21197} Elevated toilet seat or a handicapped toilet? {YES/NO:21197}    Cognitive Function:        Immunizations Immunization History  Administered Date(s) Administered  .  Influenza Inj Mdck Quad Pf 05/07/2018  . Influenza Split 07/07/2013, 05/23/2015, 04/18/2017, 05/07/2018  . Influenza,inj,Quad PF,6+ Mos 07/07/2013  . Influenza-Unspecified 05/23/2015, 04/18/2017, 05/07/2018  . PFIZER SARS-COV-2 Vaccination 02/19/2020, 03/11/2020    TDAP status: Due, Education has been provided regarding the importance of this vaccine. Advised may receive this vaccine at local pharmacy or Health Dept. Aware to provide a copy of the vaccination record if obtained from local pharmacy or Health Dept. Verbalized acceptance and understanding.  {Flu Vaccine status:2101806}  Pneumococcal vaccine status:  Due, Education has been provided regarding the importance of this vaccine. Advised may receive this vaccine at local pharmacy or Health Dept. Aware to provide a copy of the vaccination record if obtained from local pharmacy or Health Dept. Verbalized acceptance and understanding.  Covid-19 vaccine status: Completed vaccines  Qualifies for Shingles Vaccine? Yes   Zostavax completed No   Shingrix Completed?: No.    Education has been provided regarding the importance of this vaccine. Patient has been advised to call insurance company to determine out of pocket expense if they have not yet received this vaccine. Advised may also receive vaccine at local pharmacy or Health Dept. Verbalized acceptance and understanding.  Screening Tests Health Maintenance  Topic Date Due  . Hepatitis C Screening  Never done  . HIV Screening  Never done  . TETANUS/TDAP  Never done  . COLONOSCOPY (Pts 45-52yrs Insurance coverage will need to be confirmed)  Never done  . COVID-19 Vaccine (3 - Booster for Pfizer series) 09/11/2020  . INFLUENZA VACCINE  Completed    Health Maintenance  Health Maintenance Due  Topic Date Due  . Hepatitis C Screening  Never done  . HIV Screening  Never done  . TETANUS/TDAP  Never done  . COLONOSCOPY (Pts 45-28yrs Insurance coverage will need to be confirmed)  Never  done  . COVID-19 Vaccine (3 - Booster for Pfizer series) 09/11/2020    {Colorectal cancer screening:2101809}  Lung Cancer Screening: (Low Dose CT Chest recommended if Age 1-80 years, 30 pack-year currently smoking OR have quit w/in 15years.) does not qualify.   Lung Cancer Screening Referral: N/A   Additional Screening:  Hepatitis C Screening: does qualify;  Vision Screening: Recommended annual ophthalmology exams for early detection of glaucoma and other disorders of the eye. Is the patient up to date with their annual eye exam?  {YES/NO:21197} Who is the provider or what is the name of the office in which the patient attends annual eye exams? *** If pt is not established with a provider, would they like to be referred to a provider to establish care? {YES/NO:21197}.   Dental Screening: Recommended annual dental exams for proper oral hygiene  Community Resource Referral / Chronic Care Management: CRR required this visit?  {YES/NO:21197}  CCM required this visit?  {YES/NO:21197}     Plan:     I have personally reviewed and noted the following in the patient's chart:   . Medical and social history . Use of alcohol, tobacco or illicit drugs  . Current medications and supplements . Functional ability and status . Nutritional status . Physical activity . Advanced directives . List of other physicians . Hospitalizations, surgeries, and ER visits in previous 12 months . Vitals . Screenings to include cognitive, depression, and falls . Referrals and appointments  In addition, I have reviewed and discussed with patient certain preventive protocols, quality metrics, and best practice recommendations. A written personalized care plan for preventive services as well as general preventive health recommendations were provided to patient.     Ofilia Neas, LPN   [id number]   Nurse Notes: ***

## 2020-09-08 NOTE — Progress Notes (Signed)
Erroneous Encounter

## 2020-09-15 DIAGNOSIS — G894 Chronic pain syndrome: Secondary | ICD-10-CM | POA: Diagnosis not present

## 2020-09-15 DIAGNOSIS — M47817 Spondylosis without myelopathy or radiculopathy, lumbosacral region: Secondary | ICD-10-CM | POA: Diagnosis not present

## 2020-09-15 DIAGNOSIS — M62830 Muscle spasm of back: Secondary | ICD-10-CM | POA: Diagnosis not present

## 2020-09-15 DIAGNOSIS — G4700 Insomnia, unspecified: Secondary | ICD-10-CM | POA: Diagnosis not present

## 2020-09-15 IMAGING — CT CT T SPINE W/ CM
1 series · 12 of 14 positions shown, 15 images · non-contrast
Comparison: none

CLINICAL DATA: Chronic low back pain. Thoracolumbar fusion T3-L2.
L5-S1 fusion. Mid low back pain extending into the lower
extremities, right greater than left
TECHNIQUE: Contiguous axial images were obtained through the Thoracic and
Lumbar spine after the intrathecal infusion of infusion. Coronal and
sagittal reconstructions were obtained of the axial image sets.

[Series 3: t spine soft (person_name) · axial · 0.43mm/px · z∈[+277,+592]mm · 12 of 125 slices shown, 15 images]
[im 10/125  soft-tissue]
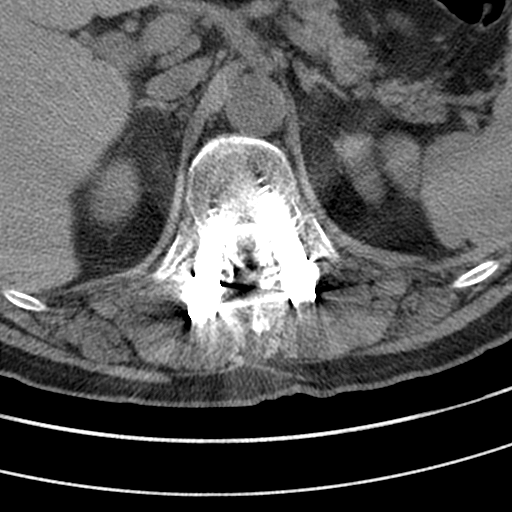
[im 10/125  bone]
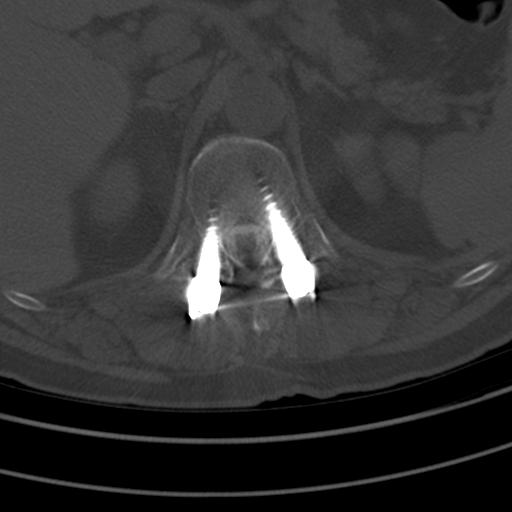
[im 20/125  bone]
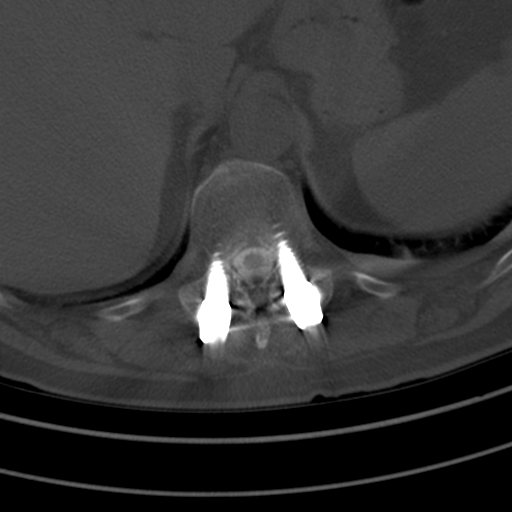
[im 29/125  bone]
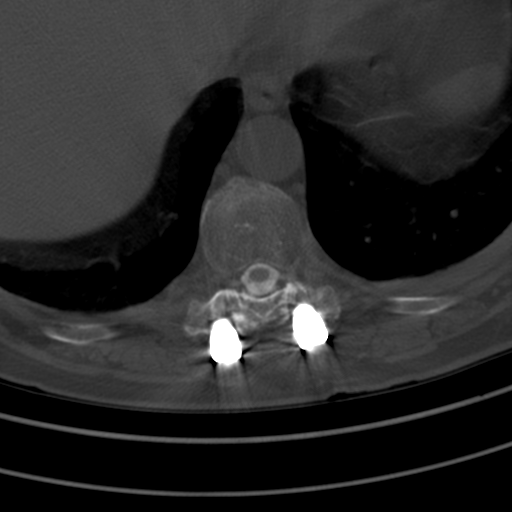
[im 39/125  bone]
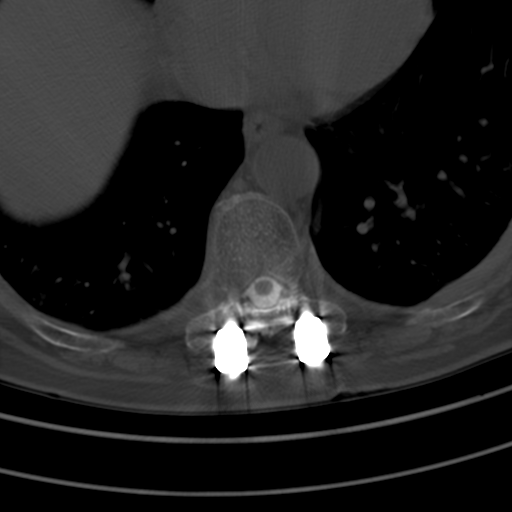
[im 48/125  soft-tissue]
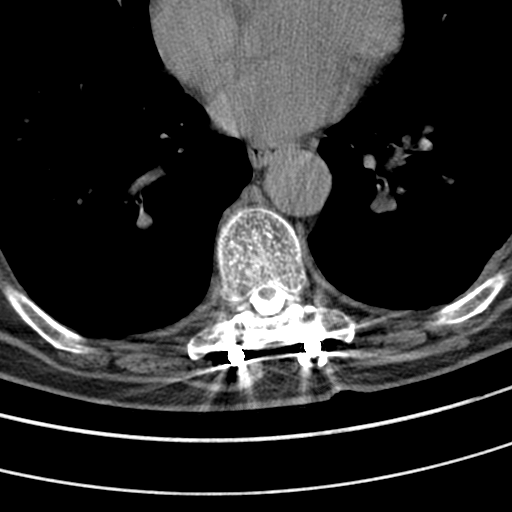
[im 48/125  bone]
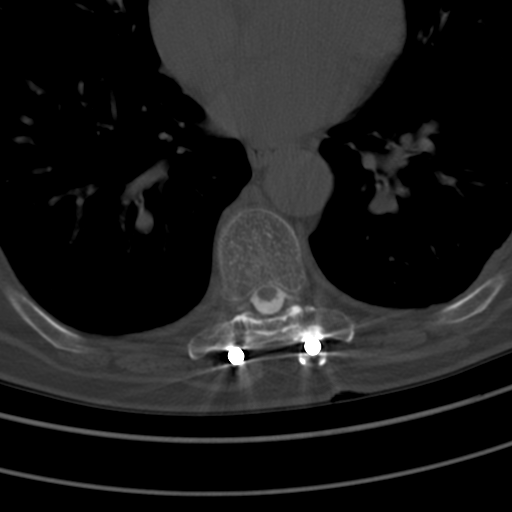
[im 58/125  bone]
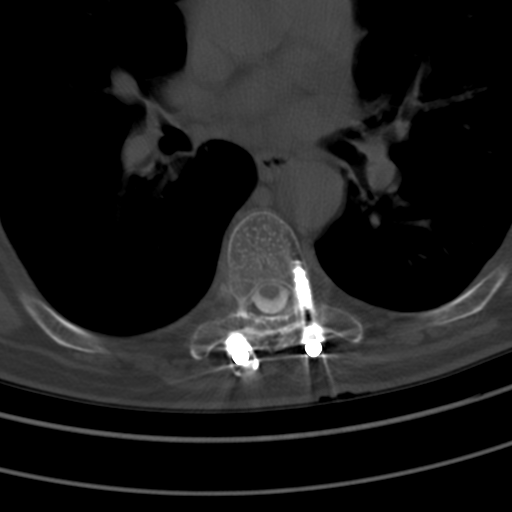
[im 67/125  bone]
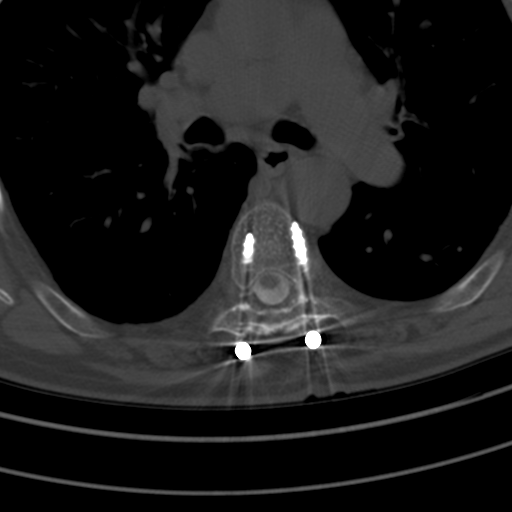
[im 77/125  bone]
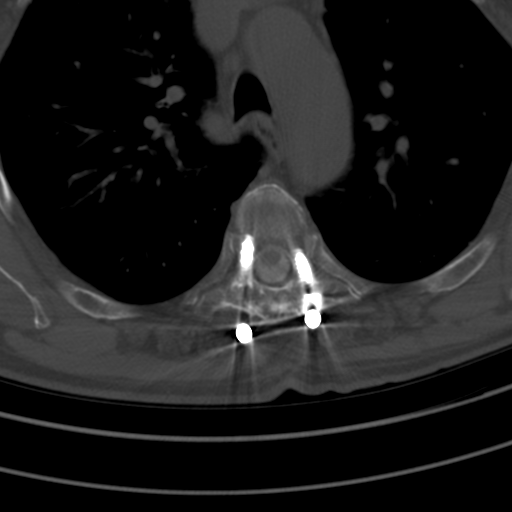
[im 86/125  soft-tissue]
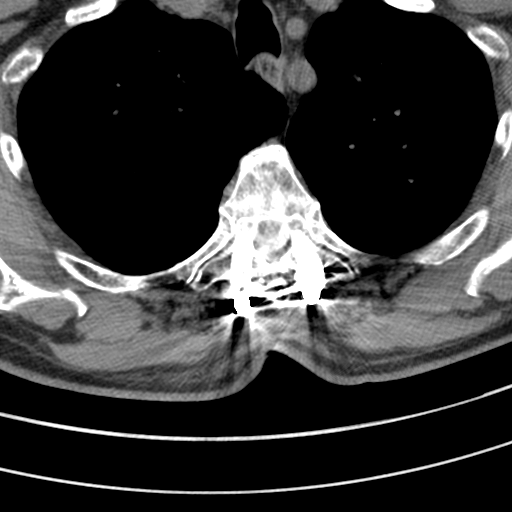
[im 86/125  bone]
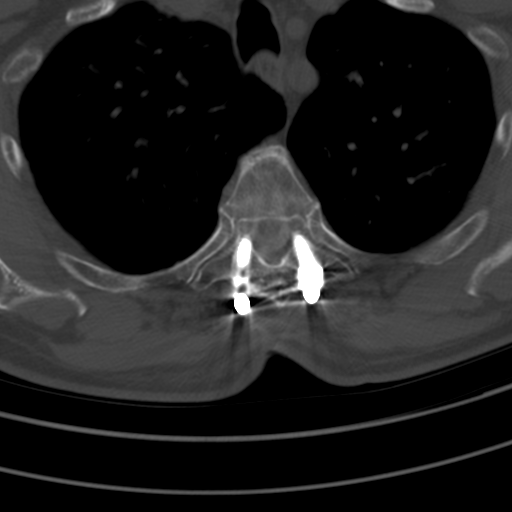
[im 96/125  bone]
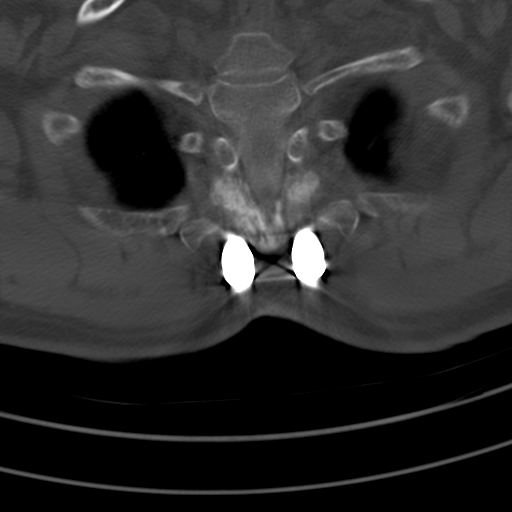
[im 105/125  bone]
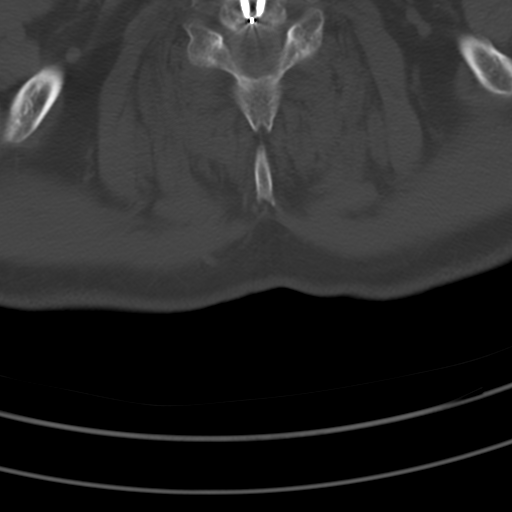
[im 115/125  bone]
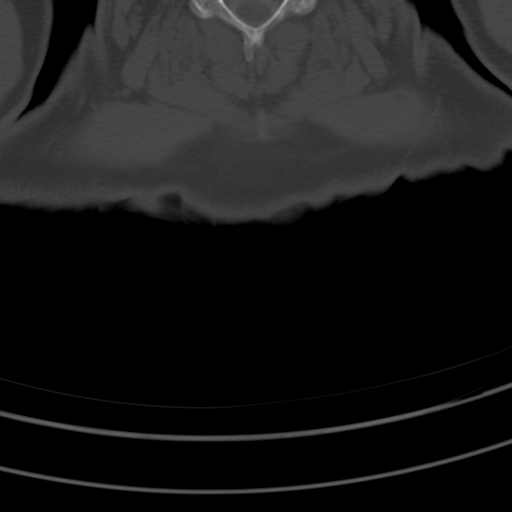

[12 of 14 positions shown; findings below may reference images not displayed]

FLUOROSCOPY TIME:  Radiation Exposure Index (as provided by the
fluoroscopic device): 636.12 uGy*m2

PROCEDURE:
LUMBAR PUNCTURE FOR THORACIC AND LUMBAR MYELOGRAM

After thorough discussion of risks and benefits of the procedure
including bleeding, infection, injury to nerves, blood vessels,
adjacent structures as well as headache and CSF leak, written and
oral informed consent was obtained. Consent was obtained by Dr.
Nokondundu Morenga.

Patient was positioned prone on the fluoroscopy table. Local
anesthesia was provided with 1% lidocaine without epinephrine after
prepped and draped in the usual sterile fashion. Puncture was
performed at L5-S1 using a 3 1/2 inch 22-gauge spinal needle via
left paramedian approach. Using a single pass through the dura, the
needle was placed within the thecal sac, with return of clear CSF.
10 mL of Isovue 7-6BB was injected into the thecal sac, with normal
opacification of the nerve roots and cauda equina consistent with
free flow within the subarachnoid space. The patient was then moved
to the trendelenburg position and contrast flowed into the Thoracic
spine region.

I personally performed the lumbar puncture and administered the
intrathecal contrast. I also personally supervised acquisition of
the myelogram images.
FINDINGS: THORACIC AND LUMBAR MYELOGRAM FINDINGS:

Posterior bilateral pedicle screw and rod fixation is noted T3-L3.
Right posterior fusion is present at L5-S1.

Slight retrolisthesis at L3-4 is stable. There is no motion with
standing, flexion, or extension. Slight retrolisthesis at L4-5 is
worse with standing and extension.

Mild central canal narrowing is present at L4-5. Nerve roots
otherwise fill normally. Focal stenosis is present in the fused
portion of the thoracic spine.

CT THORACIC MYELOGRAM FINDINGS:

Twelve rib-bearing thoracic type vertebral bodies are present. Solid
posterior fusion is noted T3-12. Cord size and morphology normal.

80 chronic superior endplate fractures present at T3 with 50% loss
of height anteriorly focal exaggerated kyphosis. Slight degenerative
anterolisthesis is present at T2-3. Posterior elements are fused on
the right and centrally at C2-3. There is effacement of the ventral
CSF at T2-3. Posterior canal is widely patent. Mild foraminal
narrowing is present bilaterally at T2-3 and right at T3-4. Foramina
are otherwise patent within the thoracic spine.

The right pedicle screw at T7 traverses the T6-7 disc without
compromise of the central canal or foramen.

CT LUMBAR MYELOGRAM FINDINGS:

Five non rib-bearing lumbar type vertebral bodies are present.

Limited imaging of the abdomen is unremarkable apart from
atherosclerotic changes at the distal aorta and proximal iliac
vessels.

L1-2: Foraminotomy is present. No residual or recurrent stenosis is
present. Posterior fusion is solid.

L2-3: Solid posterior fusion is present. Alignment is anatomic. Left
foraminotomy is noted. No residual or recurrent stenosis is present.

L3-4: Mild disc bulging is present. Disc material extends into the
foramina bilaterally. Central canal is patent. Mild facet
hypertrophy is noted bilaterally. Foramina patent.

L4-5: Broad-based disc protrusion is asymmetric to the left. Disc
extends into the foramina bilaterally. Moderate left and mild right
foraminal narrowing is present. The central canal is patent.

L5-S1: Discectomy and fusion are noted. Bridging bone is present.
Mild osseous foraminal narrowing is present on right.
IMPRESSION: 1. Solid posterior fusion at T3-12 and L5-S1 without residual or
recurrent stenosis at these levels.
2. Right pedicle screw at T7 traverses the T6-7 disc without
compromise of the central canal or foramen.
3. Mild foraminal narrowing bilaterally at T2-3 and right at T3-4.
4. Mild disc bulging and facet hypertrophy at L3-4 without
significant stenosis.
5. Moderate left and mild right foraminal stenosis at L4-5 secondary
to a broad-based disc protrusion asymmetric to the left. Mild
dynamic retrolisthesis is present at the L4-5 level.
6. Mild osseous foraminal narrowing on right at L5-S1.
7. Left foraminotomy at L1-2 and L2-3 without residual or recurrent
stenosis at these levels.
8. Aortic Atherosclerosis ([id number]).

## 2020-09-21 DIAGNOSIS — Z23 Encounter for immunization: Secondary | ICD-10-CM | POA: Diagnosis not present

## 2020-10-04 ENCOUNTER — Other Ambulatory Visit: Payer: Self-pay | Admitting: Family Medicine

## 2020-10-04 NOTE — Telephone Encounter (Signed)
Last office visit- 08/25/2020 Last refill- 04/12/2020

## 2020-10-06 DIAGNOSIS — M4005 Postural kyphosis, thoracolumbar region: Secondary | ICD-10-CM | POA: Diagnosis not present

## 2020-10-13 DIAGNOSIS — M47817 Spondylosis without myelopathy or radiculopathy, lumbosacral region: Secondary | ICD-10-CM | POA: Diagnosis not present

## 2020-10-13 DIAGNOSIS — G4700 Insomnia, unspecified: Secondary | ICD-10-CM | POA: Diagnosis not present

## 2020-10-13 DIAGNOSIS — G894 Chronic pain syndrome: Secondary | ICD-10-CM | POA: Diagnosis not present

## 2020-10-13 DIAGNOSIS — M62830 Muscle spasm of back: Secondary | ICD-10-CM | POA: Diagnosis not present

## 2020-10-20 ENCOUNTER — Telehealth: Payer: Self-pay | Admitting: Family Medicine

## 2020-10-20 NOTE — Telephone Encounter (Signed)
Left message for patient to call back and schedule Medicare Annual Wellness Visit (AWV) either virtually or in office. No detailed message left   AWVI  please schedule at anytime with [facility] Nurse Health Advisor 1 or 2   This should be a 45 minute visit. 

## 2020-10-26 ENCOUNTER — Other Ambulatory Visit: Payer: Self-pay | Admitting: Family Medicine

## 2020-11-11 DIAGNOSIS — G894 Chronic pain syndrome: Secondary | ICD-10-CM | POA: Diagnosis not present

## 2020-11-11 DIAGNOSIS — M47817 Spondylosis without myelopathy or radiculopathy, lumbosacral region: Secondary | ICD-10-CM | POA: Diagnosis not present

## 2020-11-11 DIAGNOSIS — G4700 Insomnia, unspecified: Secondary | ICD-10-CM | POA: Diagnosis not present

## 2020-11-11 DIAGNOSIS — M62830 Muscle spasm of back: Secondary | ICD-10-CM | POA: Diagnosis not present

## 2020-11-16 NOTE — Progress Notes (Signed)
Subjective:   Jesse Hanna is a 55 y.o. male who presents for an Initial Medicare Annual Wellness  Visit.  Attempted video visit. Unable to establish a connection   I connected with Jesse Hanna  today by telephone and verified that I am speaking with the correct person using two identifiers. Location patient: home Location provider: work Persons participating in the virtual visit: patient, provider.   I discussed the limitations, risks, security and privacy concerns of performing an evaluation and management service by telephone and the availability of in person appointments. I also discussed with the patient that there may be a patient responsible charge related to this service. The patient expressed understanding and verbally consented to this telephonic visit.    Interactive audio and video telecommunications were attempted between this provider and patient, however failed, due to patient having technical difficulties OR patient did not have access to video capability.  We continued and completed visit with audio only.      Review of Systems    N/A  Cardiac Risk Factors include: hypertension;advanced age (>10men, >59 women);male gender     Objective:    Today's Vitals   11/17/20 0832  PainSc: 5    There is no height or weight on file to calculate BMI.  Advanced Directives 11/17/2020 03/18/2018 02/01/2012  Does Patient Have a Medical Advance Directive? No No Patient does not have advance directive  Would patient like information on creating a medical advance directive? No - Patient declined - -    Current Medications (verified) Outpatient Encounter Medications as of 11/17/2020  Medication Sig  . amitriptyline (ELAVIL) 75 MG tablet Take 75 mg by mouth at bedtime.  Marland Kitchen amLODipine (NORVASC) 5 MG tablet TAKE 1 TABLET(5 MG) BY MOUTH DAILY  . aspirin 81 MG chewable tablet Chew by mouth daily.  . clonazePAM (KLONOPIN) 1 MG tablet TAKE 1 TABLET BY MOUTH 3 TIMES DAILY AS  NEEDED FOR ANXIETY.  Marland Kitchen gabapentin (NEURONTIN) 300 MG capsule Take 600 mg by mouth at bedtime.  . methocarbamol (ROBAXIN) 750 MG tablet Take 750 mg by mouth 4 (four) times daily.  Marland Kitchen morphine (MS CONTIN) 60 MG 12 hr tablet Take 60 mg by mouth every 8 (eight) hours.  Marland Kitchen oxyCODONE-acetaminophen (PERCOCET) 10-325 MG per tablet Take 1-3 tablets by mouth daily as needed for pain.   . pantoprazole (PROTONIX) 40 MG tablet TAKE 1 TABLET BY MOUTH EVERY DAY   No facility-administered encounter medications on file as of 11/17/2020.    Allergies (verified) Bupropion hcl, Etodolac, Hydrocodone-homatropine, Lisinopril, Perindopril erbumine, Dilaudid [hydromorphone hcl], and Morphine and related   History: Past Medical History:  Diagnosis Date  . Anxiety   . Chronic pain disorder    sees Dr. Corinna Capra   . GERD (gastroesophageal reflux disease)   . Hypertension   . Skin cancer    Past Surgical History:  Procedure Laterality Date  . CARPAL TUNNEL RELEASE  02/06/2012   Procedure: CARPAL TUNNEL RELEASE;  Surgeon: Hessie Dibble, MD;  Location: [place] [facility];  Service: Orthopedics;  Laterality: Left;  . COLONOSCOPY  01 25 2005   normal per Dr. Henrene Pastor  . CORONARY ANGIOPLASTY  2003   per Dr. Melvern Banker, normal-not had to go back  . epidural steroid shots     to thoracic spine  . ESOPHAGOGASTRODUODENOSCOPY  01 05 2005   per Dr. Henrene Pastor showed  Jesse Hanna only  . HERNIA REPAIR     right x 2  . left foot neuroma    .  ROTATOR CUFF REPAIR     left and right  . SPINAL FUSION     x3 on 12/15/07 on C6-7 and repeat on 05/16/10 then on L5-S1 on 08/23/09 per Dr. Patrice Paradise  . SPINAL FUSION  12-08-13   from T3 to L3 per Dr. Rennis Harding    Family History  Problem Relation Age of Onset  . Hypertension Other   . Cancer Father        oral  . Tuberculosis Father   . Colon polyps Mother    Social History   Socioeconomic History  . Marital status: Married    Spouse name: Not on file  . Number of children: 1  .  Years of education: Not on file  . Highest education level: Not on file  Occupational History    Employer: [employer]  Tobacco Use  . Smoking status: Current Every Day Smoker    Packs/day: 0.50    Years: 15.00    Pack years: 7.50    Types: Cigarettes  . Smokeless tobacco: Never Used  Vaping Use  . Vaping Use: Never used  Substance and Sexual Activity  . Alcohol use: No    Alcohol/week: 0.0 standard drinks  . Drug use: No  . Sexual activity: Not on file  Other Topics Concern  . Not on file  Social History Narrative   Married         Social Determinants of Health   Financial Resource Strain: Low Risk   . Difficulty of Paying Living Expenses: Not hard at all  Food Insecurity: No Food Insecurity  . Worried About Charity fundraiser in the Last Year: Never true  . Ran Out of Food in the Last Year: Never true  Transportation Needs: No Transportation Needs  . Lack of Transportation (Medical): No  . Lack of Transportation (Non-Medical): No  Physical Activity: Insufficiently Active  . Days of Exercise per Week: 7 days  . Minutes of Exercise per Session: 10 min  Stress: Not on file  Social Connections: Moderately Integrated  . Frequency of Communication with Friends and Family: More than three times a week  . Frequency of Social Gatherings with Friends and Family: Twice a week  . Attends Religious Services: More than 4 times per year  . Active Member of Clubs or Organizations: No  . Attends Archivist Meetings: Never  . Marital Status: Married    Tobacco Counseling Ready to quit: Not Answered Counseling given: Not Answered   Clinical Intake:  Pre-visit preparation completed: Yes  Pain : 0-10 Pain Score: 5  Pain Type: Chronic pain Pain Location: Back Pain Orientation: Lower Pain Descriptors / Indicators: Constant Pain Frequency: Constant Pain Relieving Factors:  (pt see pain mangment)  Pain Relieving Factors:  (pt see pain  mangment)  Nutritional Risks: Unintentional weight gain  How often do you need to have someone help you when you read instructions, pamphlets, or other written materials from your doctor or pharmacy?: 1 - Never What is the last grade level you completed in school?: High School  Diabetic?No      Information entered by :: L.Viers,LPN   Activities of Daily Living In your present state of health, do you have any difficulty performing the following activities: 11/17/2020  Hearing? N  Vision? N  Difficulty concentrating or making decisions? N  Walking or climbing stairs? Y  Dressing or bathing? N  Doing errands, shopping? N  Preparing Food and eating ? N  Using the Toilet? N  In the past six months, have you accidently leaked urine? N  Do you have problems with loss of bowel control? N  Managing your Medications? N  Managing your Finances? N  Housekeeping or managing your Housekeeping? N  Some recent data might be hidden    Patient Care Team: Laurey Morale, MD as PCP - General  Indicate any recent Medical Services you may have received from other than [facility] providers in the past year (date may be approximate).     Assessment:   This is a routine wellness examination for [facility].  Hearing/Vision screen  Hearing Screening   125Hz  250Hz  500Hz  1000Hz  2000Hz  3000Hz  4000Hz  6000Hz  8000Hz   Right ear:           Left ear:           Vision Screening Comments: Pt wears glasses and has no been seen , states no covered benefit   Dietary issues and exercise activities discussed: Current Exercise Habits: Home exercise routine, Type of exercise: walking, Time (Minutes): 10, Frequency (Times/Week): 7, Weekly Exercise (Minutes/Week): 70, Intensity: Mild, Exercise limited by: orthopedic condition(s)  Goals    . Exercise 3x per week (30 min per time)     Pt is starting physical therapy to help with walking without a cane.      Depression Screen PHQ 2/9 Scores 11/17/2020 08/25/2020  PHQ  - 2 Score 0 2  PHQ- 9 Score - 8    Fall Risk Fall Risk  11/17/2020  Falls in the past year? 0  Number falls in past yr: 0  Injury with Fall? 0  Risk for fall due to : Impaired balance/gait  Follow up Falls evaluation completed    FALL RISK PREVENTION PERTAINING TO THE HOME:  Any stairs in or around the home? No  If so, are there any without handrails? No  Home free of loose throw rugs in walkways, pet beds, electrical cords, etc? Yes  Adequate lighting in your home to reduce risk of falls? Yes   ASSISTIVE DEVICES UTILIZED TO PREVENT FALLS:  Life alert? No  Use of a cane, walker or w/c? Yes  Grab bars in the bathroom? Yes  Shower chair or bench in shower? Yes  Elevated toilet seat or a handicapped toilet? No     Cognitive Function:     Normal cognitive status assessed by direct observation by this Nurse Health Advisor. No abnormalities found.      Immunizations Immunization History  Administered Date(s) Administered  . Influenza Inj Mdck Quad Pf 05/07/2018  . Influenza Split 07/07/2013, 05/23/2015, 04/18/2017, 05/07/2018  . Influenza,inj,Quad PF,6+ Mos 07/07/2013  . Influenza-Unspecified 05/23/2015, 04/18/2017, 05/07/2018  . PFIZER(Purple Top)SARS-COV-2 Vaccination 02/19/2020, 03/11/2020    TDAP status: Due, Education has been provided regarding the importance of this vaccine. Advised may receive this vaccine at local pharmacy or Health Dept. Aware to provide a copy of the vaccination record if obtained from local pharmacy or Health Dept. Verbalized acceptance and understanding.  Flu Vaccine status: Due, Education has been provided regarding the importance of this vaccine. Advised may receive this vaccine at local pharmacy or Health Dept. Aware to provide a copy of the vaccination record if obtained from local pharmacy or Health Dept. Verbalized acceptance and understanding.  Pneumococcal vaccine status: Up to date  Covid-19 vaccine status: Completed  vaccines  Qualifies for Shingles Vaccine? Yes   Zostavax completed No   Shingrix Completed?: No.    Education has been provided regarding the importance of this vaccine.  Patient has been advised to call insurance company to determine out of pocket expense if they have not yet received this vaccine. Advised may also receive vaccine at local pharmacy or Health Dept. Verbalized acceptance and understanding.  Screening Tests Health Maintenance  Topic Date Due  . Hepatitis C Screening  Never done  . HIV Screening  Never done  . TETANUS/TDAP  Never done  . COLONOSCOPY (Pts 45-26yrs Insurance coverage will need to be confirmed)  Never done  . COVID-19 Vaccine (3 - Booster for Pfizer series) 09/11/2020  . INFLUENZA VACCINE  Completed  . HPV VACCINES  Aged Out    Health Maintenance  Health Maintenance Due  Topic Date Due  . Hepatitis C Screening  Never done  . HIV Screening  Never done  . TETANUS/TDAP  Never done  . COLONOSCOPY (Pts 45-15yrs Insurance coverage will need to be confirmed)  Never done  . COVID-19 Vaccine (3 - Booster for Pfizer series) 09/11/2020    Colorectal cancer screening: Referral to GI placed 11/17/2020. Pt aware the office will call re: appt.  Lung Cancer Screening: (Low Dose CT Chest recommended if Age 56-80 years, 30 pack-year currently smoking OR have quit w/in 15years.) does not qualify.   Lung Cancer Screening Referral: N/A   Additional Screening:  Hepatitis C Screening: does qualify;   Vision Screening: Recommended annual ophthalmology exams for early detection of glaucoma and other disorders of the eye. Is the patient up to date with their annual eye exam?  No  Who is the provider or what is the name of the office in which the patient attends annual eye exams? Does not have a current eye doctor  If pt is not established with a provider, would they like to be referred to a provider to establish care? No .   Dental Screening: Recommended annual dental  exams for proper oral hygiene  Community Resource Referral / Chronic Care Management: CRR required this visit?  No   CCM required this visit?  No      Plan:     I have personally reviewed and noted the following in the patient's chart:   . Medical and social history . Use of alcohol, tobacco or illicit drugs  . Current medications and supplements . Functional ability and status . Nutritional status . Physical activity . Advanced directives . List of other physicians . Hospitalizations, surgeries, and ER visits in previous 12 months . Vitals . Screenings to include cognitive, depression, and falls . Referrals and appointments  In addition, I have reviewed and discussed with patient certain preventive protocols, quality metrics, and best practice recommendations. A written personalized care plan for preventive services as well as general preventive health recommendations were provided to patient.     Ofilia Neas, LPN   10/26/6008   Nurse Notes: None

## 2020-11-17 ENCOUNTER — Ambulatory Visit (INDEPENDENT_AMBULATORY_CARE_PROVIDER_SITE_OTHER): Payer: Medicare Other

## 2020-11-17 DIAGNOSIS — Z0000 Encounter for general adult medical examination without abnormal findings: Secondary | ICD-10-CM

## 2020-11-17 DIAGNOSIS — Z1211 Encounter for screening for malignant neoplasm of colon: Secondary | ICD-10-CM | POA: Diagnosis not present

## 2020-12-06 ENCOUNTER — Other Ambulatory Visit: Payer: Self-pay | Admitting: Family Medicine

## 2020-12-06 DIAGNOSIS — G894 Chronic pain syndrome: Secondary | ICD-10-CM | POA: Diagnosis not present

## 2020-12-06 DIAGNOSIS — M4325 Fusion of spine, thoracolumbar region: Secondary | ICD-10-CM | POA: Diagnosis not present

## 2020-12-06 DIAGNOSIS — M4005 Postural kyphosis, thoracolumbar region: Secondary | ICD-10-CM | POA: Diagnosis not present

## 2020-12-09 DIAGNOSIS — G4700 Insomnia, unspecified: Secondary | ICD-10-CM | POA: Diagnosis not present

## 2020-12-09 DIAGNOSIS — M47817 Spondylosis without myelopathy or radiculopathy, lumbosacral region: Secondary | ICD-10-CM | POA: Diagnosis not present

## 2020-12-09 DIAGNOSIS — M62830 Muscle spasm of back: Secondary | ICD-10-CM | POA: Diagnosis not present

## 2020-12-09 DIAGNOSIS — G894 Chronic pain syndrome: Secondary | ICD-10-CM | POA: Diagnosis not present

## 2020-12-15 ENCOUNTER — Other Ambulatory Visit: Payer: Self-pay | Admitting: Rehabilitation

## 2020-12-15 ENCOUNTER — Telehealth: Payer: Self-pay

## 2020-12-15 DIAGNOSIS — M4325 Fusion of spine, thoracolumbar region: Secondary | ICD-10-CM

## 2020-12-15 NOTE — Telephone Encounter (Signed)
Phone call to patient to verify medication list and allergies for myelogram procedure. Pt instructed to hold Elavil for 48hrs prior to myelogram appointment time and 24 hours after appointment. Pt also instructed to have a driver the day of the procedure, the procedure would take around 2 hours, and discharge instructions discussed. Pt verbalized understanding.

## 2020-12-21 ENCOUNTER — Ambulatory Visit (INDEPENDENT_AMBULATORY_CARE_PROVIDER_SITE_OTHER): Payer: Medicare Other | Admitting: Family Medicine

## 2020-12-21 ENCOUNTER — Other Ambulatory Visit: Payer: Self-pay

## 2020-12-21 ENCOUNTER — Encounter: Payer: Self-pay | Admitting: Family Medicine

## 2020-12-21 VITALS — BP 118/76 | HR 87 | Temp 98.2°F | Wt 218.0 lb

## 2020-12-21 DIAGNOSIS — J0191 Acute recurrent sinusitis, unspecified: Secondary | ICD-10-CM | POA: Diagnosis not present

## 2020-12-21 DIAGNOSIS — I10 Essential (primary) hypertension: Secondary | ICD-10-CM

## 2020-12-21 DIAGNOSIS — M25472 Effusion, left ankle: Secondary | ICD-10-CM | POA: Diagnosis not present

## 2020-12-21 DIAGNOSIS — M25471 Effusion, right ankle: Secondary | ICD-10-CM

## 2020-12-21 MED ORDER — METOPROLOL SUCCINATE ER 50 MG PO TB24: 50.0000 mg | ORAL_TABLET | Freq: Every day | ORAL | 3 refills | Status: DC

## 2020-12-21 MED ORDER — ALBUTEROL SULFATE HFA 108 (90 BASE) MCG/ACT IN AERS: 2.0000 | INHALATION_SPRAY | RESPIRATORY_TRACT | 2 refills | Status: DC | PRN

## 2020-12-21 MED ORDER — AMOXICILLIN-POT CLAVULANATE 875-125 MG PO TABS: 1.0000 | ORAL_TABLET | Freq: Two times a day (BID) | ORAL | 0 refills | Status: DC

## 2020-12-21 NOTE — Progress Notes (Signed)
   Subjective:    Patient ID: Jesse Hanna, male    DOB: Mar 29, 1966, 55 y.o.   MRN: [id number]  HPI Here for several issues. First he has noticed swelling in his feet and ankles over the past week. No SOB. No leg pain. Last November he was taken off Lisinopril and was started on Amlodipine. This has worked well to control his BP. Also for the past 2 weeks he has what he thinks is another sinus infection. He has stuffy head, PND, and is blowing green mucus from the nose. No cough or SOB or fever.    Review of Systems  Constitutional: Negative.   HENT: Positive for congestion, postnasal drip and sinus pressure. Negative for sore throat.   Eyes: Negative.   Respiratory: Negative.   Cardiovascular: Positive for leg swelling. Negative for chest pain and palpitations.       Objective:   Physical Exam Constitutional:      Appearance: Normal appearance. He is not ill-appearing.  HENT:     Right Ear: Tympanic membrane, ear canal and external ear normal.     Left Ear: Tympanic membrane, ear canal and external ear normal.     Nose: Nose normal.     Mouth/Throat:     Pharynx: Oropharynx is clear.  Eyes:     Conjunctiva/sclera: Conjunctivae normal.  Cardiovascular:     Rate and Rhythm: Normal rate and regular rhythm.     Pulses: Normal pulses.     Heart sounds: Normal heart sounds.  Pulmonary:     Effort: Pulmonary effort is normal.     Breath sounds: Normal breath sounds.  Musculoskeletal:     Comments: 1+ edema in both ankles and feet   Lymphadenopathy:     Cervical: No cervical adenopathy.  Neurological:     Mental Status: He is alert.           Assessment & Plan:  His ankle edema is likely to be a side effect of the Amlodipine. We will stop this and start him on Metoprolol succinate 50 mg daily. He will report back to [place] in 4 weeks. He also has a sinusitis, and we will treat this with Augmentin. Alysia Penna, MD

## 2020-12-27 ENCOUNTER — Ambulatory Visit
Admission: RE | Admit: 2020-12-27 | Discharge: 2020-12-27 | Disposition: A | Discharge status: Home / Self Care | Payer: Medicare Other | Source: Ambulatory Visit | Attending: Rehabilitation | Admitting: Rehabilitation

## 2020-12-27 ENCOUNTER — Other Ambulatory Visit: Payer: Self-pay

## 2020-12-27 DIAGNOSIS — M5416 Radiculopathy, lumbar region: Secondary | ICD-10-CM | POA: Diagnosis not present

## 2020-12-27 DIAGNOSIS — M4325 Fusion of spine, thoracolumbar region: Secondary | ICD-10-CM

## 2020-12-27 DIAGNOSIS — M4326 Fusion of spine, lumbar region: Secondary | ICD-10-CM | POA: Diagnosis not present

## 2020-12-27 DIAGNOSIS — M4316 Spondylolisthesis, lumbar region: Secondary | ICD-10-CM | POA: Diagnosis not present

## 2020-12-27 MED ORDER — IOPAMIDOL (ISOVUE-M 200) INJECTION 41%
18.0000 mL | Freq: Once | INTRAMUSCULAR | Status: AC
  Administered 2020-12-27: 18 mL via INTRATHECAL

## 2020-12-27 MED ORDER — DIAZEPAM 5 MG PO TABS
5.0000 mg | ORAL_TABLET | Freq: Once | ORAL | Status: AC
  Administered 2020-12-27: 5 mg via ORAL

## 2020-12-27 NOTE — Progress Notes (Signed)
Pt reports he has been off of his elavil for at least 48 hours.

## 2020-12-27 NOTE — Discharge Instructions (Signed)
Myelogram Discharge Instructions  1. Go home and rest quietly as needed. You may resume normal activities; however, do not exert yourself strongly or do any heavy lifting today and tomorrow.   2. DO NOT drive today.    3. You may resume your normal diet and medications unless otherwise indicated. Drink lots of extra fluids today and tomorrow.   4. The incidence of headache, nausea, or vomiting is about 5% (one in 20 patients).  If you develop a headache, lie flat for 24 hours and drink plenty of fluids until the headache goes away.  Caffeinated beverages may be helpful. If when you get up you still have a headache when standing, go back to bed and force fluids for another 24 hours.   5. If you develop severe nausea and vomiting or a headache that does not go away with the flat bedrest after 48 hours, please call [phone].   6. Call your physician for a follow-up appointment.  The results of your myelogram will be sent directly to your physician by the following day.  7. If you have any questions or if complications develop after you arrive home, please call [phone].  Discharge instructions have been explained to the patient.  The patient, or the person responsible for the patient, fully understands these instructions.   Thank you for visiting our office today.   YOU MAY RESUME YOUR ELAVIL TOMORROW 12/28/20 AT 10:30 AM

## 2020-12-29 DIAGNOSIS — M1611 Unilateral primary osteoarthritis, right hip: Secondary | ICD-10-CM | POA: Diagnosis not present

## 2020-12-29 DIAGNOSIS — G894 Chronic pain syndrome: Secondary | ICD-10-CM | POA: Diagnosis not present

## 2020-12-29 DIAGNOSIS — Z6830 Body mass index (BMI) 30.0-30.9, adult: Secondary | ICD-10-CM | POA: Diagnosis not present

## 2020-12-29 DIAGNOSIS — M4325 Fusion of spine, thoracolumbar region: Secondary | ICD-10-CM | POA: Diagnosis not present

## 2021-01-03 DIAGNOSIS — M1611 Unilateral primary osteoarthritis, right hip: Secondary | ICD-10-CM | POA: Diagnosis not present

## 2021-01-05 DIAGNOSIS — M62830 Muscle spasm of back: Secondary | ICD-10-CM | POA: Diagnosis not present

## 2021-01-05 DIAGNOSIS — G894 Chronic pain syndrome: Secondary | ICD-10-CM | POA: Diagnosis not present

## 2021-01-05 DIAGNOSIS — M47817 Spondylosis without myelopathy or radiculopathy, lumbosacral region: Secondary | ICD-10-CM | POA: Diagnosis not present

## 2021-01-05 DIAGNOSIS — Z79891 Long term (current) use of opiate analgesic: Secondary | ICD-10-CM | POA: Diagnosis not present

## 2021-01-05 DIAGNOSIS — G4700 Insomnia, unspecified: Secondary | ICD-10-CM | POA: Diagnosis not present

## 2021-01-22 ENCOUNTER — Other Ambulatory Visit: Payer: Self-pay | Admitting: Family Medicine

## 2021-02-02 DIAGNOSIS — G894 Chronic pain syndrome: Secondary | ICD-10-CM | POA: Diagnosis not present

## 2021-02-02 DIAGNOSIS — M62830 Muscle spasm of back: Secondary | ICD-10-CM | POA: Diagnosis not present

## 2021-02-02 DIAGNOSIS — M47817 Spondylosis without myelopathy or radiculopathy, lumbosacral region: Secondary | ICD-10-CM | POA: Diagnosis not present

## 2021-02-02 DIAGNOSIS — G4700 Insomnia, unspecified: Secondary | ICD-10-CM | POA: Diagnosis not present

## 2021-03-04 DIAGNOSIS — M47817 Spondylosis without myelopathy or radiculopathy, lumbosacral region: Secondary | ICD-10-CM | POA: Diagnosis not present

## 2021-03-04 DIAGNOSIS — G894 Chronic pain syndrome: Secondary | ICD-10-CM | POA: Diagnosis not present

## 2021-03-04 DIAGNOSIS — G4700 Insomnia, unspecified: Secondary | ICD-10-CM | POA: Diagnosis not present

## 2021-03-04 DIAGNOSIS — M62830 Muscle spasm of back: Secondary | ICD-10-CM | POA: Diagnosis not present

## 2021-03-05 ENCOUNTER — Other Ambulatory Visit: Payer: Self-pay | Admitting: Family Medicine

## 2021-03-08 ENCOUNTER — Other Ambulatory Visit: Payer: Self-pay

## 2021-03-08 MED ORDER — PANTOPRAZOLE SODIUM 40 MG PO TBEC: 40.0000 mg | DELAYED_RELEASE_TABLET | Freq: Every day | ORAL | 0 refills | Status: DC

## 2021-03-25 DIAGNOSIS — M47817 Spondylosis without myelopathy or radiculopathy, lumbosacral region: Secondary | ICD-10-CM | POA: Diagnosis not present

## 2021-03-25 DIAGNOSIS — G894 Chronic pain syndrome: Secondary | ICD-10-CM | POA: Diagnosis not present

## 2021-03-25 DIAGNOSIS — M7062 Trochanteric bursitis, left hip: Secondary | ICD-10-CM | POA: Diagnosis not present

## 2021-03-25 DIAGNOSIS — G4700 Insomnia, unspecified: Secondary | ICD-10-CM | POA: Diagnosis not present

## 2021-03-25 DIAGNOSIS — M62830 Muscle spasm of back: Secondary | ICD-10-CM | POA: Diagnosis not present

## 2021-03-28 ENCOUNTER — Other Ambulatory Visit: Payer: Self-pay | Admitting: Family Medicine

## 2021-03-28 NOTE — Telephone Encounter (Signed)
Pt LOV was on 12/21/2020 Last refill was done on 10/04/2020 for 90 with 5 refills Please advise

## 2021-04-22 DIAGNOSIS — G894 Chronic pain syndrome: Secondary | ICD-10-CM | POA: Diagnosis not present

## 2021-04-22 DIAGNOSIS — M62830 Muscle spasm of back: Secondary | ICD-10-CM | POA: Diagnosis not present

## 2021-04-22 DIAGNOSIS — M47817 Spondylosis without myelopathy or radiculopathy, lumbosacral region: Secondary | ICD-10-CM | POA: Diagnosis not present

## 2021-04-22 DIAGNOSIS — G4700 Insomnia, unspecified: Secondary | ICD-10-CM | POA: Diagnosis not present

## 2021-05-03 DIAGNOSIS — Z23 Encounter for immunization: Secondary | ICD-10-CM | POA: Diagnosis not present

## 2021-05-23 DIAGNOSIS — M7061 Trochanteric bursitis, right hip: Secondary | ICD-10-CM | POA: Diagnosis not present

## 2021-05-23 DIAGNOSIS — M47817 Spondylosis without myelopathy or radiculopathy, lumbosacral region: Secondary | ICD-10-CM | POA: Diagnosis not present

## 2021-05-23 DIAGNOSIS — G4700 Insomnia, unspecified: Secondary | ICD-10-CM | POA: Diagnosis not present

## 2021-05-23 DIAGNOSIS — M62830 Muscle spasm of back: Secondary | ICD-10-CM | POA: Diagnosis not present

## 2021-05-23 DIAGNOSIS — G894 Chronic pain syndrome: Secondary | ICD-10-CM | POA: Diagnosis not present

## 2021-06-20 DIAGNOSIS — M47817 Spondylosis without myelopathy or radiculopathy, lumbosacral region: Secondary | ICD-10-CM | POA: Diagnosis not present

## 2021-06-20 DIAGNOSIS — G4700 Insomnia, unspecified: Secondary | ICD-10-CM | POA: Diagnosis not present

## 2021-06-20 DIAGNOSIS — G894 Chronic pain syndrome: Secondary | ICD-10-CM | POA: Diagnosis not present

## 2021-06-20 DIAGNOSIS — M62830 Muscle spasm of back: Secondary | ICD-10-CM | POA: Diagnosis not present

## 2021-06-22 ENCOUNTER — Telehealth: Payer: Self-pay | Admitting: Family Medicine

## 2021-06-22 DIAGNOSIS — Z209 Contact with and (suspected) exposure to unspecified communicable disease: Secondary | ICD-10-CM

## 2021-06-22 NOTE — Telephone Encounter (Signed)
Pt is requesting a Hepatitis C Screening and a Tetanus/Tdap for sometime next week.  He is requesting it per MyChart message.

## 2021-06-23 NOTE — Telephone Encounter (Signed)
Please advise 

## 2021-06-24 NOTE — Telephone Encounter (Addendum)
FYI Informed by Nurse Nelida Gores that due to patient having Medicare Tdap injection should be done by local pharmacy or patient to pay up front for cost of injection. Sent patient message with information.

## 2021-06-24 NOTE — Telephone Encounter (Signed)
Requested my chart message sent to patient about appointments.     Lab appointment schedule for 06/29/21 at [place], nurse visit appointment scheduled for 06/29/21 at 9:30am.

## 2021-06-24 NOTE — Telephone Encounter (Signed)
I ordered the Hep C test. Please schedule the TDaP

## 2021-06-29 ENCOUNTER — Ambulatory Visit: Payer: Medicare Other

## 2021-06-29 ENCOUNTER — Telehealth: Payer: Self-pay | Admitting: *Deleted

## 2021-06-29 ENCOUNTER — Other Ambulatory Visit: Payer: Medicare Other

## 2021-06-29 NOTE — Telephone Encounter (Signed)
I noticed the patient is on the nurses schedule for a Tdap at 9:30am.  Patient cannot receive a Tdap vaccine here due to his insurance and should contact the pharmacy for this.  I left a message for the patient to return my call.

## 2021-07-05 ENCOUNTER — Other Ambulatory Visit: Payer: Self-pay | Admitting: Family Medicine

## 2021-07-18 DIAGNOSIS — G4700 Insomnia, unspecified: Secondary | ICD-10-CM | POA: Diagnosis not present

## 2021-07-18 DIAGNOSIS — M62830 Muscle spasm of back: Secondary | ICD-10-CM | POA: Diagnosis not present

## 2021-07-18 DIAGNOSIS — M47817 Spondylosis without myelopathy or radiculopathy, lumbosacral region: Secondary | ICD-10-CM | POA: Diagnosis not present

## 2021-07-18 DIAGNOSIS — G894 Chronic pain syndrome: Secondary | ICD-10-CM | POA: Diagnosis not present

## 2021-08-15 DIAGNOSIS — M47817 Spondylosis without myelopathy or radiculopathy, lumbosacral region: Secondary | ICD-10-CM | POA: Diagnosis not present

## 2021-08-15 DIAGNOSIS — M62830 Muscle spasm of back: Secondary | ICD-10-CM | POA: Diagnosis not present

## 2021-08-15 DIAGNOSIS — G894 Chronic pain syndrome: Secondary | ICD-10-CM | POA: Diagnosis not present

## 2021-08-15 DIAGNOSIS — G4700 Insomnia, unspecified: Secondary | ICD-10-CM | POA: Diagnosis not present

## 2021-09-12 DIAGNOSIS — G894 Chronic pain syndrome: Secondary | ICD-10-CM | POA: Diagnosis not present

## 2021-09-12 DIAGNOSIS — M62830 Muscle spasm of back: Secondary | ICD-10-CM | POA: Diagnosis not present

## 2021-09-12 DIAGNOSIS — M47817 Spondylosis without myelopathy or radiculopathy, lumbosacral region: Secondary | ICD-10-CM | POA: Diagnosis not present

## 2021-09-12 DIAGNOSIS — G4700 Insomnia, unspecified: Secondary | ICD-10-CM | POA: Diagnosis not present

## 2021-09-21 ENCOUNTER — Other Ambulatory Visit: Payer: Self-pay | Admitting: Family Medicine

## 2021-09-21 NOTE — Telephone Encounter (Signed)
Pt LOV was on 12/21/2020 Last refill done on 03/29/21 Please advise

## 2021-10-05 ENCOUNTER — Other Ambulatory Visit: Payer: Self-pay | Admitting: Family Medicine

## 2021-10-11 DIAGNOSIS — G4700 Insomnia, unspecified: Secondary | ICD-10-CM | POA: Diagnosis not present

## 2021-10-11 DIAGNOSIS — M62830 Muscle spasm of back: Secondary | ICD-10-CM | POA: Diagnosis not present

## 2021-10-11 DIAGNOSIS — M47817 Spondylosis without myelopathy or radiculopathy, lumbosacral region: Secondary | ICD-10-CM | POA: Diagnosis not present

## 2021-10-11 DIAGNOSIS — G894 Chronic pain syndrome: Secondary | ICD-10-CM | POA: Diagnosis not present

## 2021-10-25 ENCOUNTER — Encounter: Payer: Self-pay | Admitting: Family Medicine

## 2021-10-25 ENCOUNTER — Ambulatory Visit (INDEPENDENT_AMBULATORY_CARE_PROVIDER_SITE_OTHER): Payer: Medicare Other | Admitting: Family Medicine

## 2021-10-25 ENCOUNTER — Telehealth: Payer: Self-pay | Admitting: Family Medicine

## 2021-10-25 VITALS — BP 124/78 | HR 95 | Temp 98.5°F | Wt 236.0 lb

## 2021-10-25 DIAGNOSIS — F411 Generalized anxiety disorder: Secondary | ICD-10-CM | POA: Diagnosis not present

## 2021-10-25 DIAGNOSIS — N644 Mastodynia: Secondary | ICD-10-CM | POA: Diagnosis not present

## 2021-10-25 DIAGNOSIS — I10 Essential (primary) hypertension: Secondary | ICD-10-CM | POA: Diagnosis not present

## 2021-10-25 DIAGNOSIS — J019 Acute sinusitis, unspecified: Secondary | ICD-10-CM

## 2021-10-25 MED ORDER — ALPRAZOLAM 1 MG PO TABS: 1.0000 mg | ORAL_TABLET | Freq: Three times a day (TID) | ORAL | 5 refills | Status: DC | PRN

## 2021-10-25 MED ORDER — AMOXICILLIN-POT CLAVULANATE 875-125 MG PO TABS: 1.0000 | ORAL_TABLET | Freq: Two times a day (BID) | ORAL | 0 refills | Status: DC

## 2021-10-25 MED ORDER — METHYLPREDNISOLONE ACETATE 80 MG/ML IJ SUSP
80.0000 mg | Freq: Once | INTRAMUSCULAR | Status: AC
  Administered 2021-10-25: 80 mg via INTRAMUSCULAR

## 2021-10-25 MED ORDER — METHYLPREDNISOLONE ACETATE 40 MG/ML IJ SUSP
40.0000 mg | Freq: Once | INTRAMUSCULAR | Status: AC
  Administered 2021-10-25: 40 mg via INTRAMUSCULAR

## 2021-10-25 NOTE — Telephone Encounter (Signed)
Pt is calling and [facility] [address] in [place]  does not have ALPRAZolam (XANAX) 1 MG tablet  [id number] [place] [place] [facility] [place] Phone:  [phone]  Fax:  [phone]    Pt would like callback once med has been sent

## 2021-10-25 NOTE — Telephone Encounter (Signed)
Patient called to follow up on [facility] [address]. I let patient know that message was sent previously but they are seeing patients and I would send another back.    Please send to  [id number] - [place], [place] Phone:  [phone]  Fax:  [phone]        Please advise

## 2021-10-25 NOTE — Telephone Encounter (Signed)
Spoke with pt pharmacy states that the Xanax is out of stock, pt requests a new Rx sent to [facility] [place], Please advise

## 2021-10-25 NOTE — Progress Notes (Signed)
° °  Subjective:    Patient ID: Jesse Hanna, male    DOB: May 17, 1966, 56 y.o.   MRN: [id number]  HPI Here for several issues. First about 2 weeks ago he developed sinus pressure, left ear pain, and PND. No ST or fever or cough. He takes Flonase for springtime allergies. Also over the past year he has begun to gain a lot of weight, and he is not sure why. He also has noted some swelling and soreness in both breasts for a few months. Lastly he asks to change from Clonazepam back to Alprazolam for his anxiety. He stopped taking Metoprolol a year ago because it was causing his feet to swell. After stopping it the swelling resolved, and his BP has been stable ever since.    Review of Systems  Constitutional: Negative.   HENT:  Positive for congestion, ear pain, postnasal drip and sinus pain. Negative for sore throat.   Eyes: Negative.   Respiratory: Negative.    Cardiovascular: Negative.   Psychiatric/Behavioral:  Negative for agitation, confusion, decreased concentration, dysphoric mood, hallucinations and sleep disturbance. The patient is nervous/anxious.       Objective:   Physical Exam Constitutional:      Appearance: Normal appearance.  HENT:     Right Ear: Tympanic membrane, ear canal and external ear normal.     Left Ear: Tympanic membrane, ear canal and external ear normal.     Nose: Nose normal.     Mouth/Throat:     Pharynx: Oropharynx is clear.  Eyes:     Conjunctiva/sclera: Conjunctivae normal.  Cardiovascular:     Rate and Rhythm: Normal rate and regular rhythm.     Pulses: Normal pulses.     Heart sounds: Normal heart sounds.  Pulmonary:     Effort: Pulmonary effort is normal.     Breath sounds: Normal breath sounds.  Lymphadenopathy:     Cervical: No cervical adenopathy.  Neurological:     Mental Status: He is alert.  Psychiatric:        Mood and Affect: Mood normal.        Behavior: Behavior normal.        Thought Content: Thought content normal.           Assessment & Plan:  For the sinus infection, we will treat this with Augmentin and a DepoMedrol shot. His HTN is well controlled. For the anxiety, we will switch from Clonazpam to Alprazolam. His breast swelling is likely due to falling testosterone levels. I asked him to schedule a complete physical with labs in the next few week. We will check a testosterone level at that time.  We spent a total of ( 31  ) minutes reviewing records and discussing these issues.  Alysia Penna, MD

## 2021-10-25 NOTE — Telephone Encounter (Signed)
I sent these to the new pharmacy

## 2021-10-25 NOTE — Addendum Note (Signed)
Addended by: Wyvonne Lenz on: 10/25/2021 11:06 AM   Modules accepted: Orders

## 2021-11-08 DIAGNOSIS — M62830 Muscle spasm of back: Secondary | ICD-10-CM | POA: Diagnosis not present

## 2021-11-08 DIAGNOSIS — M47817 Spondylosis without myelopathy or radiculopathy, lumbosacral region: Secondary | ICD-10-CM | POA: Diagnosis not present

## 2021-11-08 DIAGNOSIS — G894 Chronic pain syndrome: Secondary | ICD-10-CM | POA: Diagnosis not present

## 2021-11-08 DIAGNOSIS — G4700 Insomnia, unspecified: Secondary | ICD-10-CM | POA: Diagnosis not present

## 2021-11-10 ENCOUNTER — Encounter: Payer: Self-pay | Admitting: Family Medicine

## 2021-11-10 ENCOUNTER — Ambulatory Visit (INDEPENDENT_AMBULATORY_CARE_PROVIDER_SITE_OTHER): Payer: Medicare Other | Admitting: Family Medicine

## 2021-11-10 VITALS — BP 124/80 | HR 76 | Temp 98.0°F | Ht 71.0 in | Wt 234.0 lb

## 2021-11-10 DIAGNOSIS — N138 Other obstructive and reflux uropathy: Secondary | ICD-10-CM | POA: Diagnosis not present

## 2021-11-10 DIAGNOSIS — M546 Pain in thoracic spine: Secondary | ICD-10-CM | POA: Diagnosis not present

## 2021-11-10 DIAGNOSIS — R739 Hyperglycemia, unspecified: Secondary | ICD-10-CM | POA: Diagnosis not present

## 2021-11-10 DIAGNOSIS — N401 Enlarged prostate with lower urinary tract symptoms: Secondary | ICD-10-CM

## 2021-11-10 DIAGNOSIS — K219 Gastro-esophageal reflux disease without esophagitis: Secondary | ICD-10-CM

## 2021-11-10 DIAGNOSIS — Z23 Encounter for immunization: Secondary | ICD-10-CM

## 2021-11-10 DIAGNOSIS — I10 Essential (primary) hypertension: Secondary | ICD-10-CM

## 2021-11-10 DIAGNOSIS — Z0000 Encounter for general adult medical examination without abnormal findings: Secondary | ICD-10-CM

## 2021-11-10 DIAGNOSIS — F411 Generalized anxiety disorder: Secondary | ICD-10-CM | POA: Diagnosis not present

## 2021-11-10 DIAGNOSIS — R6882 Decreased libido: Secondary | ICD-10-CM

## 2021-11-10 DIAGNOSIS — L409 Psoriasis, unspecified: Secondary | ICD-10-CM

## 2021-11-10 LAB — BASIC METABOLIC PANEL
BUN: 11 mg/dL (ref 6–23)
CO2: 34 mEq/L — ABNORMAL HIGH (ref 19–32)
Calcium: 10 mg/dL (ref 8.4–10.5)
Chloride: 97 mEq/L (ref 96–112)
Creatinine, Ser: 0.85 mg/dL (ref 0.40–1.50)
GFR: 97.33 mL/min (ref >60.00)
Glucose, Bld: 101 mg/dL — ABNORMAL HIGH (ref 70–99)
Potassium: 4.8 mEq/L (ref 3.5–5.1)
Sodium: 138 mEq/L (ref 135–145)

## 2021-11-10 LAB — CBC WITH DIFFERENTIAL/PLATELET
Basophils Absolute: 0.1 10*3/uL (ref 0.0–0.1)
Basophils Relative: 1.2 % (ref 0.0–3.0)
Eosinophils Absolute: 0.1 10*3/uL (ref 0.0–0.7)
Eosinophils Relative: 1.8 % (ref 0.0–5.0)
HCT: 47.4 % (ref 39.0–52.0)
Hemoglobin: 15.5 g/dL (ref 13.0–17.0)
Lymphocytes Relative: 33 % (ref 12.0–46.0)
Lymphs Abs: 1.7 10*3/uL (ref 0.7–4.0)
MCHC: 32.6 g/dL (ref 30.0–36.0)
MCV: 90.9 fl (ref 78.0–100.0)
Monocytes Absolute: 0.4 10*3/uL (ref 0.1–1.0)
Monocytes Relative: 7.8 % (ref 3.0–12.0)
Neutro Abs: 2.9 10*3/uL (ref 1.4–7.7)
Neutrophils Relative %: 56.2 % (ref 43.0–77.0)
Platelets: 278 10*3/uL (ref 150.0–400.0)
RBC: 5.21 Mil/uL (ref 4.22–5.81)
RDW: 14 % (ref 11.5–15.5)
WBC: 5.2 10*3/uL (ref 4.0–10.5)

## 2021-11-10 LAB — LIPID PANEL
Cholesterol: 208 mg/dL — ABNORMAL HIGH (ref 0–200)
HDL: 43.7 mg/dL (ref >39.00)
LDL Cholesterol: 140 mg/dL — ABNORMAL HIGH (ref 0–99)
NonHDL: 164.24
Total CHOL/HDL Ratio: 5
Triglycerides: 122 mg/dL (ref 0.0–149.0)
VLDL: 24.4 mg/dL (ref 0.0–40.0)

## 2021-11-10 LAB — HEPATIC FUNCTION PANEL
ALT: 18 U/L (ref 0–53)
AST: 19 U/L (ref 0–37)
Albumin: 4.8 g/dL (ref 3.5–5.2)
Alkaline Phosphatase: 70 U/L (ref 39–117)
Bilirubin, Direct: 0.1 mg/dL (ref 0.0–0.3)
Total Bilirubin: 0.3 mg/dL (ref 0.2–1.2)
Total Protein: 7.6 g/dL (ref 6.0–8.3)

## 2021-11-10 LAB — TESTOSTERONE: Testosterone: 167.59 ng/dL — ABNORMAL LOW (ref 300.00–890.00)

## 2021-11-10 LAB — PSA: PSA: 0.06 ng/mL — ABNORMAL LOW (ref 0.10–4.00)

## 2021-11-10 LAB — HEMOGLOBIN A1C: Hgb A1c MFr Bld: 6.7 % — ABNORMAL HIGH (ref 4.6–6.5)

## 2021-11-10 LAB — TSH: TSH: 2.73 u[IU]/mL (ref 0.35–5.50)

## 2021-11-10 MED ORDER — PANTOPRAZOLE SODIUM 40 MG PO TBEC: 40.0000 mg | DELAYED_RELEASE_TABLET | Freq: Every morning | ORAL | 3 refills | Status: DC

## 2021-11-10 MED ORDER — ALBUTEROL SULFATE HFA 108 (90 BASE) MCG/ACT IN AERS: 2.0000 | INHALATION_SPRAY | RESPIRATORY_TRACT | 5 refills | Status: DC | PRN

## 2021-11-10 MED ORDER — FAMOTIDINE 40 MG PO TABS: 40.0000 mg | ORAL_TABLET | Freq: Every evening | ORAL | 3 refills | Status: DC

## 2021-11-10 NOTE — Progress Notes (Signed)
? ?Subjective:  ? ? Patient ID: Jesse Hanna, male    DOB: 12-26-1965, 56 y.o.   MRN: [id number] ? ?HPI ?Here to follow up on issues. He is doing fairly well except for his chronic back pain. This has been stable for the past year on his current medications. He looks forward to getting outside for walks once the weather warms up. He keeps his psoriasis under control by applying Desitin as needed. He takes Pantoprazole every morning, but he often has breakthrough heartburn in the evenings. His BP has been stable. He also mentions a low libido and some mild swelling in his breasts over the last year. ? ? ?Review of Systems  ?Constitutional: Negative.   ?HENT: Negative.    ?Eyes: Negative.   ?Respiratory: Negative.    ?Cardiovascular: Negative.   ?Gastrointestinal: Negative.   ?Genitourinary: Negative.   ?Musculoskeletal:  Positive for back pain and neck pain.  ?Skin: Negative.   ?Neurological: Negative.   ?Psychiatric/Behavioral: Negative.    ? ?   ?Objective:  ? Physical Exam ?Constitutional:   ?   General: He is not in acute distress. ?   Appearance: Normal appearance. He is well-developed. He is not diaphoretic.  ?HENT:  ?   Head: Normocephalic and atraumatic.  ?   Right Ear: External ear normal.  ?   Left Ear: External ear normal.  ?   Nose: Nose normal.  ?   Mouth/Throat:  ?   Pharynx: No oropharyngeal exudate.  ?Eyes:  ?   General: No scleral icterus.    ?   Right eye: No discharge.     ?   Left eye: No discharge.  ?   Conjunctiva/sclera: Conjunctivae normal.  ?   Pupils: Pupils are equal, round, and reactive to light.  ?Neck:  ?   Thyroid: No thyromegaly.  ?   Vascular: No JVD.  ?   Trachea: No tracheal deviation.  ?Cardiovascular:  ?   Rate and Rhythm: Normal rate and regular rhythm.  ?   Heart sounds: Normal heart sounds. No murmur heard. ?  No friction rub. No gallop.  ?Pulmonary:  ?   Effort: Pulmonary effort is normal. No respiratory distress.  ?   Breath sounds: Normal breath sounds. No wheezing  or rales.  ?   Comments: He has mild gynecomastia in both breasts, no lumps or tenderness  ?Chest:  ?   Chest wall: No tenderness.  ?Abdominal:  ?   General: Bowel sounds are normal. There is no distension.  ?   Palpations: Abdomen is soft. There is no mass.  ?   Tenderness: There is no abdominal tenderness. There is no guarding or rebound.  ?Genitourinary: ?   Penis: Normal. No tenderness.   ?   Testes: Normal.  ?   Prostate: Normal.  ?   Rectum: Normal. Guaiac result negative.  ?Musculoskeletal:     ?   General: No tenderness. Normal range of motion.  ?   Cervical back: Neck supple.  ?Lymphadenopathy:  ?   Cervical: No cervical adenopathy.  ?Skin: ?   General: Skin is warm and dry.  ?   Coloration: Skin is not pale.  ?   Findings: No erythema.  ?   Comments: He has multiple faint pink areas throughout the trunk and buttocks   ?Neurological:  ?   Mental Status: He is alert and oriented to person, place, and time.  ?   Cranial Nerves: No cranial nerve deficit.  ?  Motor: No abnormal muscle tone.  ?   Coordination: Coordination normal.  ?   Deep Tendon Reflexes: Reflexes are normal and symmetric. Reflexes normal.  ?Psychiatric:     ?   Behavior: Behavior normal.     ?   Thought Content: Thought content normal.     ?   Judgment: Judgment normal.  ? ? ? ? ? ?   ?Assessment & Plan:  ?His HTN and psoriasis and back pain are all stable. He will get fasting labs today to check lipids, A1c, etc. We will also check a testosterone level. I imagine this is low, and treating it would help the gynecomastia as well as the low libido. For the GERD, we will add Famotidine 40 mg every evening to his regimen. Set up a colonoscopy. We spent a total of ( 33  ) minutes reviewing records and discussing these issues.  ?Alysia Penna, MD ? ? ? ?

## 2021-11-16 ENCOUNTER — Telehealth: Payer: Self-pay | Admitting: Family Medicine

## 2021-11-16 MED ORDER — TESTOSTERONE CYPIONATE 200 MG/ML IM SOLN: 200.0000 mg | INTRAMUSCULAR | 1 refills | Status: DC

## 2021-11-16 MED ORDER — "SYRINGE 21G X 1-1/2"" 3 ML MISC": 1.0000 "application " | 0 refills | Status: DC

## 2021-11-16 NOTE — Telephone Encounter (Signed)
Patient stated that there are several medications that they cover for testosterone. Patient stated that the insurance just informed Dr.Fry to prescribe something and they would more lilely cover generic products. ? ?If anyone needs to call patient he said they could call him and he would provide them with any additional information. ? ?Please advise. ?

## 2021-11-16 NOTE — Telephone Encounter (Signed)
See result message from 11/14/21 ?

## 2021-11-16 NOTE — Addendum Note (Signed)
Addended by: Alysia Penna A on: 11/16/2021 12:40 PM ? ? Modules accepted: Orders ? ?

## 2021-11-16 NOTE — Telephone Encounter (Addendum)
Called patient made aware that prescription has been sent for testosterone injections also for 6 month testosterone recheck ?

## 2021-11-16 NOTE — Telephone Encounter (Signed)
I sent in RX for the testosterone shots. We can recheck a level in 6 months  ?

## 2021-11-18 ENCOUNTER — Ambulatory Visit: Payer: Medicare Other

## 2021-11-21 ENCOUNTER — Ambulatory Visit: Payer: Medicare Other

## 2021-12-06 DIAGNOSIS — G4700 Insomnia, unspecified: Secondary | ICD-10-CM | POA: Diagnosis not present

## 2021-12-06 DIAGNOSIS — M62830 Muscle spasm of back: Secondary | ICD-10-CM | POA: Diagnosis not present

## 2021-12-06 DIAGNOSIS — Z79891 Long term (current) use of opiate analgesic: Secondary | ICD-10-CM | POA: Diagnosis not present

## 2021-12-06 DIAGNOSIS — M47817 Spondylosis without myelopathy or radiculopathy, lumbosacral region: Secondary | ICD-10-CM | POA: Diagnosis not present

## 2021-12-06 DIAGNOSIS — G894 Chronic pain syndrome: Secondary | ICD-10-CM | POA: Diagnosis not present

## 2021-12-19 ENCOUNTER — Ambulatory Visit: Payer: Medicare Other

## 2021-12-19 ENCOUNTER — Telehealth: Payer: Self-pay

## 2021-12-19 NOTE — Telephone Encounter (Signed)
This nurse attempted to call patient three times for telephonic AWV. Message left that we will all again to reschedule for another time. ?

## 2021-12-26 ENCOUNTER — Telehealth: Payer: Self-pay | Admitting: Family Medicine

## 2021-12-26 NOTE — Telephone Encounter (Signed)
Left message for patient to call back and schedule Medicare Annual Wellness Visit (AWV) either virtually or in office. Left  my jabber number [phone]   Last AWV ;11/17/20  please schedule at anytime with [facility] Nurse Health Advisor 1 or 2    

## 2022-01-03 DIAGNOSIS — G894 Chronic pain syndrome: Secondary | ICD-10-CM | POA: Diagnosis not present

## 2022-01-03 DIAGNOSIS — M62830 Muscle spasm of back: Secondary | ICD-10-CM | POA: Diagnosis not present

## 2022-01-03 DIAGNOSIS — G4700 Insomnia, unspecified: Secondary | ICD-10-CM | POA: Diagnosis not present

## 2022-01-03 DIAGNOSIS — M47817 Spondylosis without myelopathy or radiculopathy, lumbosacral region: Secondary | ICD-10-CM | POA: Diagnosis not present

## 2022-01-25 ENCOUNTER — Ambulatory Visit
Admission: RE | Admit: 2022-01-25 | Discharge: 2022-01-25 | Disposition: A | Discharge status: Home / Self Care | Payer: Medicare Other | Source: Ambulatory Visit | Attending: Physical Medicine and Rehabilitation | Admitting: Physical Medicine and Rehabilitation

## 2022-01-25 ENCOUNTER — Other Ambulatory Visit: Payer: Self-pay | Admitting: Physical Medicine and Rehabilitation

## 2022-01-25 DIAGNOSIS — R52 Pain, unspecified: Secondary | ICD-10-CM

## 2022-01-25 DIAGNOSIS — M542 Cervicalgia: Secondary | ICD-10-CM | POA: Diagnosis not present

## 2022-01-31 ENCOUNTER — Telehealth: Payer: Self-pay | Admitting: Family Medicine

## 2022-01-31 DIAGNOSIS — G894 Chronic pain syndrome: Secondary | ICD-10-CM | POA: Diagnosis not present

## 2022-01-31 DIAGNOSIS — M62830 Muscle spasm of back: Secondary | ICD-10-CM | POA: Diagnosis not present

## 2022-01-31 DIAGNOSIS — M47817 Spondylosis without myelopathy or radiculopathy, lumbosacral region: Secondary | ICD-10-CM | POA: Diagnosis not present

## 2022-01-31 DIAGNOSIS — G4700 Insomnia, unspecified: Secondary | ICD-10-CM | POA: Diagnosis not present

## 2022-01-31 NOTE — Telephone Encounter (Signed)
Returned pt call  Pt returned my call

## 2022-01-31 NOTE — Telephone Encounter (Signed)
Left message for patient to call back and schedule Medicare Annual Wellness Visit (AWV) either virtually or in office. Left  my Herbie Drape number [phone]   Last AWV 11/17/20 ; please schedule at anytime with [facility] Nurse Health Advisor 1 or 2   This should be a 45 minute visit.

## 2022-02-21 DIAGNOSIS — G4700 Insomnia, unspecified: Secondary | ICD-10-CM | POA: Diagnosis not present

## 2022-02-21 DIAGNOSIS — M62830 Muscle spasm of back: Secondary | ICD-10-CM | POA: Diagnosis not present

## 2022-02-21 DIAGNOSIS — M47817 Spondylosis without myelopathy or radiculopathy, lumbosacral region: Secondary | ICD-10-CM | POA: Diagnosis not present

## 2022-02-21 DIAGNOSIS — G894 Chronic pain syndrome: Secondary | ICD-10-CM | POA: Diagnosis not present

## 2022-02-23 ENCOUNTER — Telehealth: Payer: Self-pay | Admitting: Family Medicine

## 2022-02-23 NOTE — Telephone Encounter (Signed)
Left message for patient to call back and schedule Medicare Annual Wellness Visit (AWV) either virtually or in office. Left  my Jesse Hanna number [phone]   Last AWV ;10/28/20  please schedule at anytime with [facility] Nurse Health Advisor 1 or 2

## 2022-03-09 ENCOUNTER — Telehealth: Payer: Self-pay | Admitting: Family Medicine

## 2022-03-09 NOTE — Telephone Encounter (Signed)
Tried calling patient to schedule Medicare Annual Wellness Visit (AWV) either virtually or in office. Left  my Herbie Drape number [phone]  No answer Sent my chart message   Last AWV ;11/17/20  please schedule at anytime with [facility] Nurse Health Advisor 1 or 2

## 2022-03-14 ENCOUNTER — Ambulatory Visit (INDEPENDENT_AMBULATORY_CARE_PROVIDER_SITE_OTHER): Payer: Medicare Other | Admitting: Family Medicine

## 2022-03-14 ENCOUNTER — Encounter: Payer: Self-pay | Admitting: Family Medicine

## 2022-03-14 VITALS — BP 136/80 | HR 89 | Temp 98.6°F | Wt 237.0 lb

## 2022-03-14 DIAGNOSIS — E291 Testicular hypofunction: Secondary | ICD-10-CM | POA: Diagnosis not present

## 2022-03-14 DIAGNOSIS — I10 Essential (primary) hypertension: Secondary | ICD-10-CM | POA: Diagnosis not present

## 2022-03-14 MED ORDER — "SYRINGE 21G X 1-1/2"" 3 ML MISC": 1.0000 "application " | 2 refills | Status: AC

## 2022-03-14 MED ORDER — TESTOSTERONE CYPIONATE 200 MG/ML IM SOLN: 200.0000 mg | INTRAMUSCULAR | 2 refills | Status: DC

## 2022-03-14 MED ORDER — METOPROLOL SUCCINATE ER 50 MG PO TB24: 50.0000 mg | ORAL_TABLET | Freq: Every day | ORAL | 3 refills | Status: DC

## 2022-03-14 NOTE — Progress Notes (Signed)
   Subjective:    Patient ID: Jesse Hanna, male    DOB: 1966-01-11, 56 y.o.   MRN: [id number]  HPI Here for several issues. First he needs refills of testosterone. He started this about 6 months ago, and he has seen some improvement in libido but not much. He asks if the dose can be increased. Also he is concerned about his BP. He had been on Metoprolol succinate about a year ago, and we agreed to stop it because her was doing so well. Now he has had some readings around 140/90 lately, and he often wakes up a little dizzy in the mornings. No chest pain or SOB.    Review of Systems  Constitutional: Negative.   Respiratory: Negative.    Cardiovascular: Negative.   Neurological:  Positive for dizziness. Negative for headaches.       Objective:   Physical Exam Constitutional:      Appearance: Normal appearance.  Cardiovascular:     Rate and Rhythm: Normal rate and regular rhythm.     Pulses: Normal pulses.     Heart sounds: Normal heart sounds.  Pulmonary:     Effort: Pulmonary effort is normal.     Breath sounds: Normal breath sounds.  Musculoskeletal:     Right lower leg: No edema.     Left lower leg: No edema.  Neurological:     Mental Status: He is alert.  Psychiatric:        Mood and Affect: Mood normal.        Behavior: Behavior normal.        Thought Content: Thought content normal.           Assessment & Plan:  His HTN is a problem again, so we will start him back on Metoprolol succinate 50 mg daily. Recheck here in 2 months. For the hypogonadis, we will increase the testosterone to 200 mg every 7 days. We will recheck a blood level in 2 months.  Alysia Penna, MD

## 2022-03-28 DIAGNOSIS — G4700 Insomnia, unspecified: Secondary | ICD-10-CM | POA: Diagnosis not present

## 2022-03-28 DIAGNOSIS — G894 Chronic pain syndrome: Secondary | ICD-10-CM | POA: Diagnosis not present

## 2022-03-28 DIAGNOSIS — M47817 Spondylosis without myelopathy or radiculopathy, lumbosacral region: Secondary | ICD-10-CM | POA: Diagnosis not present

## 2022-03-28 DIAGNOSIS — M62830 Muscle spasm of back: Secondary | ICD-10-CM | POA: Diagnosis not present

## 2022-04-11 ENCOUNTER — Telehealth: Payer: Self-pay | Admitting: Family Medicine

## 2022-04-11 NOTE — Telephone Encounter (Signed)
Left message for patient to call back and schedule Medicare Annual Wellness Visit (AWV) either virtually or in office. Left  my Herbie Drape number [phone]   Last AWV 11/17/20  please schedule at anytime with [facility] Nurse Health Advisor 1 or 2

## 2022-04-14 ENCOUNTER — Emergency Department (HOSPITAL_BASED_OUTPATIENT_CLINIC_OR_DEPARTMENT_OTHER)
Admission: EM | Admit: 2022-04-14 | Discharge: 2022-04-14 | Disposition: A | Discharge status: Home / Self Care | Payer: Medicare Other | Attending: Emergency Medicine | Admitting: Emergency Medicine

## 2022-04-14 ENCOUNTER — Encounter (HOSPITAL_BASED_OUTPATIENT_CLINIC_OR_DEPARTMENT_OTHER): Payer: Self-pay | Admitting: Pharmacy Technician

## 2022-04-14 ENCOUNTER — Other Ambulatory Visit: Payer: Self-pay

## 2022-04-14 ENCOUNTER — Emergency Department (HOSPITAL_BASED_OUTPATIENT_CLINIC_OR_DEPARTMENT_OTHER): Payer: Medicare Other

## 2022-04-14 ENCOUNTER — Ambulatory Visit: Payer: Medicare Other | Admitting: Adult Health

## 2022-04-14 DIAGNOSIS — I80292 Phlebitis and thrombophlebitis of other deep vessels of left lower extremity: Secondary | ICD-10-CM | POA: Diagnosis not present

## 2022-04-14 DIAGNOSIS — Z7901 Long term (current) use of anticoagulants: Secondary | ICD-10-CM | POA: Insufficient documentation

## 2022-04-14 DIAGNOSIS — Z86718 Personal history of other venous thrombosis and embolism: Secondary | ICD-10-CM | POA: Insufficient documentation

## 2022-04-14 DIAGNOSIS — M79605 Pain in left leg: Secondary | ICD-10-CM | POA: Diagnosis not present

## 2022-04-14 DIAGNOSIS — I808 Phlebitis and thrombophlebitis of other sites: Secondary | ICD-10-CM | POA: Diagnosis not present

## 2022-04-14 DIAGNOSIS — I809 Phlebitis and thrombophlebitis of unspecified site: Secondary | ICD-10-CM

## 2022-04-14 DIAGNOSIS — Z7982 Long term (current) use of aspirin: Secondary | ICD-10-CM | POA: Diagnosis not present

## 2022-04-14 DIAGNOSIS — M79604 Pain in right leg: Secondary | ICD-10-CM | POA: Diagnosis present

## 2022-04-14 NOTE — Discharge Instructions (Addendum)
Diagnosed with superficial phlebitis in the left lower leg which is inflammation of veins near the surface of the skin. U/S did not reveal any concerns of DVT. Recommend that you follow up with PCP sometime next week regarding your leg pain and possible treatment for phlebitis if indicated at that time. If new chest pain, shortness of breath, or worsening leg pain or swelling please return to the ED for further evaluation.

## 2022-04-14 NOTE — ED Notes (Signed)
Reviewed AVS/discharge instruction with patient. Time allotted for and all questions answered. Patient is agreeable for d/c and escorted to ed exit by staff.  

## 2022-04-14 NOTE — ED Triage Notes (Signed)
Pt here with pain in R calf onset yesterday. Reports history of DVT in the past. Denies chest pain/ shob.

## 2022-04-14 NOTE — ED Provider Notes (Signed)
[facility] EMERGENCY DEPT Provider Note   CSN: [id number] Arrival date & time: 04/14/22  1430     History  Chief Complaint  Patient presents with   Leg Pain   HPI Jesse Hanna is a 56 y.o. male with h/o DVT in 2019 and chronic pain presenting for right leg pain. Started two days ago. Located right mid calf. Pain is sharp and non radiating. Worse with standing and sitting down. Takes morphine and oxycodone for chronic pain. Managed in pain clinic. Also sates that was on a 6 month course of Xarelto for DVT in 2019 and now takes aspirin daily for DVT prophylaxis. Denies chest pain, SOB, and abdominal pain. Denies trauma.   Leg Pain      Home Medications Prior to Admission medications   Medication Sig Start Date End Date Taking? Authorizing Provider  albuterol (VENTOLIN HFA) 108 (90 Base) MCG/ACT inhaler Inhale 2 puffs into the lungs every 4 (four) hours as needed for wheezing or shortness of breath. 11/10/21   Laurey Morale, MD  ALPRAZolam Duanne Moron) 1 MG tablet Take 1 tablet (1 mg total) by mouth 3 (three) times daily as needed for anxiety. 10/25/21   Laurey Morale, MD  amitriptyline (ELAVIL) 75 MG tablet Take 75 mg by mouth at bedtime.    [provider]  aspirin 81 MG chewable tablet Chew by mouth daily.    [provider]  famotidine (PEPCID) 40 MG tablet Take 1 tablet (40 mg total) by mouth every evening. 11/10/21   Laurey Morale, MD  gabapentin (NEURONTIN) 300 MG capsule Take 600 mg by mouth at bedtime. 02/19/18   [provider]  methocarbamol (ROBAXIN) 750 MG tablet Take 750 mg by mouth 4 (four) times daily.    [provider]  metoprolol succinate (TOPROL-XL) 50 MG 24 hr tablet Take 1 tablet (50 mg total) by mouth daily. Take with or immediately following a meal. 03/14/22   Laurey Morale, MD  morphine (MS CONTIN) 60 MG 12 hr tablet Take 60 mg by mouth every 8 (eight) hours. 11/19/14   [provider]  Multiple  Vitamin (MULTI-VITAMIN) tablet Take 1 tablet by mouth daily.    [provider]  NARCAN 4 MG/0.1ML LIQD nasal spray kit Place 4 mg into the nose as needed. 07/23/20   [provider]  Oxycodone HCl 10 MG TABS Take 10 mg by mouth 5 (five) times daily as needed. 03/28/22   [provider]  oxyCODONE-acetaminophen (PERCOCET) 10-325 MG per tablet Take 1-3 tablets by mouth daily as needed for pain.     [provider]  pantoprazole (PROTONIX) 40 MG tablet Take 1 tablet (40 mg total) by mouth in the morning. 11/10/21 02/08/22  Laurey Morale, MD  Syringe/Needle, Disp, (SYRINGE 3CC/21GX1-1/2") 21G X 1-1/2" 3 ML MISC 1 application  by Does not apply route every 14 (fourteen) days. 03/14/22   Laurey Morale, MD  testosterone cypionate (DEPOTESTOSTERONE CYPIONATE) 200 MG/ML injection Inject 1 mL (200 mg total) into the muscle every 7 (seven) days. 03/14/22   Laurey Morale, MD      Allergies    Bupropion hcl, Etodolac, Hydrocodone bit-homatrop mbr, Lisinopril, Perindopril erbumine, Dilaudid [hydromorphone hcl], and Morphine and related    Review of Systems   Review of Systems  Musculoskeletal:        Right leg pain    Physical Exam Updated Vital Signs BP 120/83   Pulse 97   Temp 97.9 F (36.6  C)   Resp 18   SpO2 94%  Physical Exam Vitals and nursing note reviewed.  HENT:     Head: Normocephalic and atraumatic.     Mouth/Throat:     Mouth: Mucous membranes are moist.  Eyes:     General:        Right eye: No discharge.        Left eye: No discharge.     Conjunctiva/sclera: Conjunctivae normal.  Cardiovascular:     Rate and Rhythm: Normal rate and regular rhythm.     Pulses: Normal pulses.     Heart sounds: Normal heart sounds.  Pulmonary:     Effort: Pulmonary effort is normal.     Breath sounds: Normal breath sounds.  Abdominal:     General: Abdomen is flat.     Palpations: Abdomen is soft.  Musculoskeletal:     Right lower leg: Swelling present.      Left lower leg: Normal.     Comments: Right leg: mild swelling and erythema noted about medial aspect of the midcalf  Skin:    General: Skin is warm and dry.  Neurological:     General: No focal deficit present.  Psychiatric:        Mood and Affect: Mood normal.     ED Results / Procedures / Treatments   Labs (all labs ordered are listed, but only abnormal results are displayed) Labs Reviewed - No data to display  EKG None  Radiology US Venous Img Lower Unilateral Right  Result Date: 04/14/2022 CLINICAL DATA:  Pain and swelling EXAM: Right LOWER EXTREMITY VENOUS DOPPLER ULTRASOUND TECHNIQUE: Gray-scale sonography with compression, as well as color and duplex ultrasound, were performed to evaluate the deep venous system(s) from the level of the common femoral vein through the popliteal and proximal calf veins. COMPARISON:  None Available. FINDINGS: VENOUS Normal compressibility of the common femoral, superficial femoral, and popliteal veins, as well as the visualized calf veins. Visualized portions of profunda femoral vein and great saphenous vein unremarkable. No filling defects to suggest DVT on grayscale or color Doppler imaging. Doppler waveforms show normal direction of venous flow, normal respiratory plasticity and response to augmentation. Limited views of the contralateral common femoral vein are unremarkable. OTHER There is occlusive superficial phlebitis in a vein along the medial upper right calf suggesting possible superficial phlebitis in lesser saphenous vein. Limitations: none IMPRESSION: There is no evidence of acute DVT in right lower extremity. There is superficial phlebitis in subcutaneous vein in the upper medial right calf. Electronically Signed   By: Elmer Picker M.D.   On: 04/14/2022 17:36    Procedures Procedures    Medications Ordered in ED Medications - No data to display  ED Course/ Medical Decision Making/ A&P Clinical Course as of 04/14/22 2232   Fri Apr 14, 2022  2224 US Venous Img Lower Unilateral Right [JR]    Clinical Course User Index [JR] Harriet Pho, PA-C                           Medical Decision Making  This patient presents to the ED for concern of right leg pain, this involves a number of treatment options, and is a complaint that carries with it a moderate risk of complications and morbidity.  The differential diagnosis includes DVT, cellulitis, and trauma.   Co morbidities: Discussed in HPI       EMR reviewed including pt PMHx, past surgical  history and past visits to ER.   See HPI for more details   Lab Tests:   NA   Imaging Studies:  Abnormal findings. I personally reviewed all imaging studies. Imaging notable for  superficial phlebitis in subcutaneous vein in the upper  medial right calf      Cardiac Monitoring:  The patient was maintained on a cardiac monitor.  I personally viewed and interpreted the cardiac monitored which showed an underlying rhythm of:NSR    Medicines ordered:  Did not order medication.  Reevaluation of the patient after these medicines showed that the patient stayed the same I have reviewed the patients home medicines and have made adjustments as needed      Consults/Attending Physician   I discussed this case with my attending physician who cosigned this note including patient's presenting symptoms, physical exam, and planned diagnostics and interventions. Attending physician stated agreement with plan or made changes to plan which were implemented.   Reevaluation:  After the interventions noted above I re-evaluated patient and found that they have :stayed the same      Problem List / ED Course:  Patient presented right leg pain. Primary concern was DVT given history and clinical presentation. U/S revealed superficial phlebitis but negative for DVT. Discussed with patient that anticoagulation was not indicated but that he should f/u with PCP  next week if pain continues to worsen. Discussed return precautions and discharged.   Dispostion:  After consideration of the diagnostic results and the patients response to treatment, I feel that the patent would benefit from discharge and f/u with PCP regarding ongoing leg pain related to superficial phlebitis.          Final Clinical Impression(s) / ED Diagnoses Final diagnoses:  Phlebitis  Left leg pain    Rx / DC Orders ED Discharge Orders     None         Harriet Pho, PA-C 04/14/22 2232    Tegeler, Gwenyth Allegra, MD 04/15/22 0009

## 2022-04-20 ENCOUNTER — Ambulatory Visit (INDEPENDENT_AMBULATORY_CARE_PROVIDER_SITE_OTHER): Payer: Medicare Other | Admitting: Family Medicine

## 2022-04-20 ENCOUNTER — Encounter: Payer: Self-pay | Admitting: Family Medicine

## 2022-04-20 VITALS — BP 118/80 | HR 92 | Temp 98.5°F | Wt 234.0 lb

## 2022-04-20 DIAGNOSIS — I8001 Phlebitis and thrombophlebitis of superficial vessels of right lower extremity: Secondary | ICD-10-CM

## 2022-04-20 MED ORDER — DICLOFENAC SODIUM 75 MG PO TBEC: 75.0000 mg | DELAYED_RELEASE_TABLET | Freq: Two times a day (BID) | ORAL | 0 refills | Status: DC

## 2022-04-20 NOTE — Progress Notes (Signed)
   Subjective:    Patient ID: Jesse Hanna, male    DOB: 05-Jul-1966, 56 y.o.   MRN: [id number]  HPI Here to follow up an ED visit on 04-14-22 for several days of pain in the right calk. A venous US was negative for a DVT but it did show a superficial phlebitis. Since then he has continued to have swelling and pain in the calf. He has not applied warm compresses. He has been taking his usual ASA 81 mg daily.    Review of Systems  Constitutional: Negative.   Respiratory: Negative.    Cardiovascular: Negative.        Objective:   Physical Exam Constitutional:      Appearance: Normal appearance. He is not ill-appearing.  Cardiovascular:     Rate and Rhythm: Normal rate and regular rhythm.     Pulses: Normal pulses.     Heart sounds: Normal heart sounds.  Pulmonary:     Effort: Pulmonary effort is normal.     Breath sounds: Normal breath sounds.  Musculoskeletal:     Comments: The right calf has a palpable superficial vein that is swollen and tender.   Neurological:     Mental Status: He is alert.           Assessment & Plan:  Superficial phlebitis. He will apply warm compresses. Increase the ASA to 325 mg BID until this resolves. Add Diclofenac 75 mg BID for the inflammation. We spent a total of (  31 ) minutes reviewing records and discussing these issues.  Alysia Penna, MD  Alysia Penna, MD

## 2022-04-26 DIAGNOSIS — M47817 Spondylosis without myelopathy or radiculopathy, lumbosacral region: Secondary | ICD-10-CM | POA: Diagnosis not present

## 2022-04-26 DIAGNOSIS — G894 Chronic pain syndrome: Secondary | ICD-10-CM | POA: Diagnosis not present

## 2022-04-26 DIAGNOSIS — M62830 Muscle spasm of back: Secondary | ICD-10-CM | POA: Diagnosis not present

## 2022-04-26 DIAGNOSIS — G4700 Insomnia, unspecified: Secondary | ICD-10-CM | POA: Diagnosis not present

## 2022-05-09 ENCOUNTER — Telehealth: Payer: Self-pay | Admitting: Family Medicine

## 2022-05-09 NOTE — Telephone Encounter (Signed)
Left message for patient to call back and schedule Medicare Annual Wellness Visit (AWV) either virtually or in office. Left  my Herbie Drape number [phone]   Last AWV ;11/17/20  please schedule at anytime with [facility] Nurse Health Advisor 1 or 2

## 2022-05-14 ENCOUNTER — Other Ambulatory Visit: Payer: Self-pay | Admitting: Family Medicine

## 2022-05-15 ENCOUNTER — Ambulatory Visit: Payer: Medicare Other | Admitting: Family Medicine

## 2022-05-16 ENCOUNTER — Other Ambulatory Visit: Payer: Self-pay

## 2022-05-16 ENCOUNTER — Other Ambulatory Visit: Payer: Self-pay | Admitting: Family Medicine

## 2022-05-16 DIAGNOSIS — I10 Essential (primary) hypertension: Secondary | ICD-10-CM

## 2022-05-16 MED ORDER — METOPROLOL SUCCINATE ER 50 MG PO TB24: 50.0000 mg | ORAL_TABLET | Freq: Every day | ORAL | 0 refills | Status: DC

## 2022-05-16 NOTE — Telephone Encounter (Signed)
Last refill- 10/25/21-90 tabs, 5 refills Next OV-05/17/22

## 2022-05-17 ENCOUNTER — Ambulatory Visit (INDEPENDENT_AMBULATORY_CARE_PROVIDER_SITE_OTHER): Payer: Medicare Other | Admitting: Family Medicine

## 2022-05-17 ENCOUNTER — Encounter: Payer: Self-pay | Admitting: Family Medicine

## 2022-05-17 VITALS — BP 124/82 | HR 82 | Temp 98.1°F | Wt 234.0 lb

## 2022-05-17 DIAGNOSIS — H6122 Impacted cerumen, left ear: Secondary | ICD-10-CM

## 2022-05-17 DIAGNOSIS — I8001 Phlebitis and thrombophlebitis of superficial vessels of right lower extremity: Secondary | ICD-10-CM | POA: Diagnosis not present

## 2022-05-17 DIAGNOSIS — Z23 Encounter for immunization: Secondary | ICD-10-CM | POA: Diagnosis not present

## 2022-05-17 MED ORDER — ALBUTEROL SULFATE HFA 108 (90 BASE) MCG/ACT IN AERS: 2.0000 | INHALATION_SPRAY | RESPIRATORY_TRACT | 11 refills | Status: DC | PRN

## 2022-05-17 MED ORDER — ASPIRIN 81 MG PO TBEC: 81.0000 mg | DELAYED_RELEASE_TABLET | Freq: Every day | ORAL | 0 refills | Status: AC

## 2022-05-17 NOTE — Progress Notes (Signed)
   Subjective:    Patient ID: Jesse Hanna, male    DOB: 20-Feb-1966, 56 y.o.   MRN: [id number]  HPI Here for several issues. First he is following up on a superficial phlebitis in the right calf. This was found on a venous doppler on 04-14-22. This was negative for any thrombi. Over the past few weeks he has been taking ASA 325 mg BID. He is using Diclofenac and warm compresses. The swelling and pain have decreased a little. He ask to see a vein specialist. Also for the last month he has felt a blockage in the left ear with decreased hearing. There is no pain.    Review of Systems  HENT:  Positive for hearing loss. Negative for congestion, ear pain, postnasal drip and sinus pressure.   Respiratory: Negative.    Cardiovascular: Negative.   Musculoskeletal:  Positive for myalgias.       Objective:   Physical Exam Constitutional:      Appearance: Normal appearance.  HENT:     Right Ear: There is no impacted cerumen.     Left Ear: There is impacted cerumen.  Cardiovascular:     Rate and Rhythm: Normal rate and regular rhythm.     Pulses: Normal pulses.     Heart sounds: Normal heart sounds.  Pulmonary:     Effort: Pulmonary effort is normal.     Breath sounds: Normal breath sounds.  Musculoskeletal:     Comments: The medial right calf shows no swelling and no cords are felt. He is still tender from just below the knee to the foot   Neurological:     Mental Status: He is alert.           Assessment & Plan:  He is recovering from a superficial phlebitis. We will decrease the ASA to one 81 mg tablet daily. Refer to the [facility] for further treatment. For the cerumen impaction, we obtained informed consent to irrigate this with water. This was done successful, and he tolerated this well. On re-exam the canal was clear and he could hear normally.  Alysia Penna, MD

## 2022-05-25 ENCOUNTER — Telehealth: Payer: Self-pay | Admitting: Family Medicine

## 2022-05-25 NOTE — Telephone Encounter (Signed)
Left message for patient to call back and schedule Medicare Annual Wellness Visit (AWV) either virtually or in office. Left  my Herbie Drape number [phone]   Last AWV 11/17/20 please schedule with Nurse Health Adviser   45 min for awv-i and in office appointments 30 min for awv-s  phone/virtual appointments

## 2022-06-05 DIAGNOSIS — M62830 Muscle spasm of back: Secondary | ICD-10-CM | POA: Diagnosis not present

## 2022-06-05 DIAGNOSIS — G894 Chronic pain syndrome: Secondary | ICD-10-CM | POA: Diagnosis not present

## 2022-06-05 DIAGNOSIS — G4700 Insomnia, unspecified: Secondary | ICD-10-CM | POA: Diagnosis not present

## 2022-06-05 DIAGNOSIS — M47817 Spondylosis without myelopathy or radiculopathy, lumbosacral region: Secondary | ICD-10-CM | POA: Diagnosis not present

## 2022-06-13 ENCOUNTER — Other Ambulatory Visit: Payer: Self-pay | Admitting: Family Medicine

## 2022-07-10 DIAGNOSIS — M47817 Spondylosis without myelopathy or radiculopathy, lumbosacral region: Secondary | ICD-10-CM | POA: Diagnosis not present

## 2022-07-10 DIAGNOSIS — G894 Chronic pain syndrome: Secondary | ICD-10-CM | POA: Diagnosis not present

## 2022-07-10 DIAGNOSIS — G4700 Insomnia, unspecified: Secondary | ICD-10-CM | POA: Diagnosis not present

## 2022-07-10 DIAGNOSIS — M62830 Muscle spasm of back: Secondary | ICD-10-CM | POA: Diagnosis not present

## 2022-07-25 ENCOUNTER — Telehealth: Payer: Self-pay

## 2022-07-25 NOTE — Telephone Encounter (Signed)
Unsuccessful attempt to reach patient on preferred number listed in notes for scheduled AWV. Voicemail full unable to leave a message.

## 2022-08-08 DIAGNOSIS — M7550 Bursitis of unspecified shoulder: Secondary | ICD-10-CM | POA: Diagnosis not present

## 2022-08-08 DIAGNOSIS — G894 Chronic pain syndrome: Secondary | ICD-10-CM | POA: Diagnosis not present

## 2022-08-08 DIAGNOSIS — M62830 Muscle spasm of back: Secondary | ICD-10-CM | POA: Diagnosis not present

## 2022-08-08 DIAGNOSIS — M47817 Spondylosis without myelopathy or radiculopathy, lumbosacral region: Secondary | ICD-10-CM | POA: Diagnosis not present

## 2022-08-08 DIAGNOSIS — M47812 Spondylosis without myelopathy or radiculopathy, cervical region: Secondary | ICD-10-CM | POA: Diagnosis not present

## 2022-08-08 DIAGNOSIS — G5601 Carpal tunnel syndrome, right upper limb: Secondary | ICD-10-CM | POA: Diagnosis not present

## 2022-08-08 DIAGNOSIS — K5900 Constipation, unspecified: Secondary | ICD-10-CM | POA: Diagnosis not present

## 2022-08-08 DIAGNOSIS — M546 Pain in thoracic spine: Secondary | ICD-10-CM | POA: Diagnosis not present

## 2022-08-08 DIAGNOSIS — Z79891 Long term (current) use of opiate analgesic: Secondary | ICD-10-CM | POA: Diagnosis not present

## 2022-08-08 DIAGNOSIS — G4700 Insomnia, unspecified: Secondary | ICD-10-CM | POA: Diagnosis not present

## 2022-08-08 DIAGNOSIS — M7060 Trochanteric bursitis, unspecified hip: Secondary | ICD-10-CM | POA: Diagnosis not present

## 2022-09-07 DIAGNOSIS — G894 Chronic pain syndrome: Secondary | ICD-10-CM | POA: Diagnosis not present

## 2022-09-07 DIAGNOSIS — G4700 Insomnia, unspecified: Secondary | ICD-10-CM | POA: Diagnosis not present

## 2022-09-07 DIAGNOSIS — M47817 Spondylosis without myelopathy or radiculopathy, lumbosacral region: Secondary | ICD-10-CM | POA: Diagnosis not present

## 2022-09-07 DIAGNOSIS — M62830 Muscle spasm of back: Secondary | ICD-10-CM | POA: Diagnosis not present

## 2022-10-05 DIAGNOSIS — G894 Chronic pain syndrome: Secondary | ICD-10-CM | POA: Diagnosis not present

## 2022-10-05 DIAGNOSIS — M62830 Muscle spasm of back: Secondary | ICD-10-CM | POA: Diagnosis not present

## 2022-10-05 DIAGNOSIS — M47817 Spondylosis without myelopathy or radiculopathy, lumbosacral region: Secondary | ICD-10-CM | POA: Diagnosis not present

## 2022-10-05 DIAGNOSIS — G4700 Insomnia, unspecified: Secondary | ICD-10-CM | POA: Diagnosis not present

## 2022-10-18 ENCOUNTER — Telehealth: Payer: Self-pay | Admitting: Family Medicine

## 2022-10-18 NOTE — Telephone Encounter (Signed)
Called patient to schedule Medicare Annual Wellness Visit (AWV). No voicemail available to leave a message.  Last date of AWV: 11/17/20  Please schedule an appointment at any time with [facility] or [employer].  If any questions, please contact me at [phone].  Thank you ,  Barkley Boards AWV direct phone # [phone]

## 2022-10-31 ENCOUNTER — Telehealth: Payer: Self-pay | Admitting: Family Medicine

## 2022-10-31 NOTE — Telephone Encounter (Signed)
Pt LOV was 05/17/2022 Last refill was done on 05/17/2022 Please advise

## 2022-10-31 NOTE — Telephone Encounter (Signed)
Prescription Request  10/31/2022  LOV: 05/17/2022  What is the name of the medication or equipment? ALPRAZolam Duanne Moron) 1 MG tablet   Have you contacted your pharmacy to request a refill? No   Which pharmacy would you like this sent to?   [id number] [place] Phone: [phone] Fax: [phone]     Patient notified that their request is being sent to the clinical staff for review and that they should receive a response within 2-3 business days.   Please advise at Mobile [phone](mobile)

## 2022-11-01 MED ORDER — ALPRAZOLAM 1 MG PO TABS: 1.0000 mg | ORAL_TABLET | Freq: Three times a day (TID) | ORAL | 5 refills | Status: DC | PRN

## 2022-11-01 NOTE — Telephone Encounter (Signed)
Done

## 2022-11-02 DIAGNOSIS — G4700 Insomnia, unspecified: Secondary | ICD-10-CM | POA: Diagnosis not present

## 2022-11-02 DIAGNOSIS — G894 Chronic pain syndrome: Secondary | ICD-10-CM | POA: Diagnosis not present

## 2022-11-02 DIAGNOSIS — M47817 Spondylosis without myelopathy or radiculopathy, lumbosacral region: Secondary | ICD-10-CM | POA: Diagnosis not present

## 2022-11-02 DIAGNOSIS — M62830 Muscle spasm of back: Secondary | ICD-10-CM | POA: Diagnosis not present

## 2022-12-01 DIAGNOSIS — M47817 Spondylosis without myelopathy or radiculopathy, lumbosacral region: Secondary | ICD-10-CM | POA: Diagnosis not present

## 2022-12-01 DIAGNOSIS — G894 Chronic pain syndrome: Secondary | ICD-10-CM | POA: Diagnosis not present

## 2022-12-01 DIAGNOSIS — G4700 Insomnia, unspecified: Secondary | ICD-10-CM | POA: Diagnosis not present

## 2022-12-01 DIAGNOSIS — M62830 Muscle spasm of back: Secondary | ICD-10-CM | POA: Diagnosis not present

## 2022-12-10 ENCOUNTER — Other Ambulatory Visit: Payer: Self-pay | Admitting: Family Medicine

## 2022-12-14 ENCOUNTER — Telehealth: Payer: Self-pay | Admitting: Family Medicine

## 2022-12-14 NOTE — Telephone Encounter (Signed)
Called patient to schedule Medicare Annual Wellness Visit (AWV). No voicemail available to leave a message.  Last date of AWV: 11/17/20  Please schedule an appointment at any time with [facility] or [facility].  If any questions, please contact me at [phone].  Thank you ,  Rudell Cobb AWV direct phone # [phone]

## 2022-12-28 DIAGNOSIS — M47817 Spondylosis without myelopathy or radiculopathy, lumbosacral region: Secondary | ICD-10-CM | POA: Diagnosis not present

## 2022-12-28 DIAGNOSIS — M62830 Muscle spasm of back: Secondary | ICD-10-CM | POA: Diagnosis not present

## 2022-12-28 DIAGNOSIS — G4700 Insomnia, unspecified: Secondary | ICD-10-CM | POA: Diagnosis not present

## 2022-12-28 DIAGNOSIS — G894 Chronic pain syndrome: Secondary | ICD-10-CM | POA: Diagnosis not present

## 2023-01-04 ENCOUNTER — Telehealth: Payer: Self-pay | Admitting: Family Medicine

## 2023-01-04 NOTE — Telephone Encounter (Signed)
Called patient to schedule Medicare Annual Wellness Visit (AWV). No voicemail available to leave a message.  Last date of AWV: 11/17/20  Please schedule an appointment at any time with [facility] or [employer].  If any questions, please contact me at [phone].  Thank you ,  Rudell Cobb AWV direct phone # [phone]

## 2023-01-15 ENCOUNTER — Telehealth: Payer: Self-pay | Admitting: Family Medicine

## 2023-01-15 NOTE — Telephone Encounter (Addendum)
Please disregard - Pt will come in for a visit, instead.

## 2023-01-16 ENCOUNTER — Ambulatory Visit (INDEPENDENT_AMBULATORY_CARE_PROVIDER_SITE_OTHER): Payer: Medicare PPO | Admitting: Family Medicine

## 2023-01-16 ENCOUNTER — Encounter: Payer: Self-pay | Admitting: Family Medicine

## 2023-01-16 VITALS — BP 118/80 | HR 68 | Temp 98.0°F | Wt 230.0 lb

## 2023-01-16 DIAGNOSIS — I10 Essential (primary) hypertension: Secondary | ICD-10-CM | POA: Diagnosis not present

## 2023-01-16 DIAGNOSIS — I83893 Varicose veins of bilateral lower extremities with other complications: Secondary | ICD-10-CM | POA: Diagnosis not present

## 2023-01-16 DIAGNOSIS — R7303 Prediabetes: Secondary | ICD-10-CM | POA: Diagnosis not present

## 2023-01-16 LAB — HEPATIC FUNCTION PANEL
ALT: 14 U/L (ref 0–53)
AST: 18 U/L (ref 0–37)
Albumin: 4.2 g/dL (ref 3.5–5.2)
Alkaline Phosphatase: 64 U/L (ref 39–117)
Bilirubin, Direct: 0.1 mg/dL (ref 0.0–0.3)
Total Bilirubin: 0.3 mg/dL (ref 0.2–1.2)
Total Protein: 7.4 g/dL (ref 6.0–8.3)

## 2023-01-16 LAB — BASIC METABOLIC PANEL
BUN: 8 mg/dL (ref 6–23)
CO2: 37 mEq/L — ABNORMAL HIGH (ref 19–32)
Calcium: 9.5 mg/dL (ref 8.4–10.5)
Chloride: 96 mEq/L (ref 96–112)
Creatinine, Ser: 0.81 mg/dL (ref 0.40–1.50)
GFR: 97.94 mL/min (ref >60.00)
Glucose, Bld: 119 mg/dL — ABNORMAL HIGH (ref 70–99)
Potassium: 4.3 mEq/L (ref 3.5–5.1)
Sodium: 139 mEq/L (ref 135–145)

## 2023-01-16 LAB — CBC WITH DIFFERENTIAL/PLATELET
Basophils Absolute: 0.1 10*3/uL (ref 0.0–0.1)
Basophils Relative: 0.9 % (ref 0.0–3.0)
Eosinophils Absolute: 0.1 10*3/uL (ref 0.0–0.7)
Eosinophils Relative: 2.5 % (ref 0.0–5.0)
HCT: 47.9 % (ref 39.0–52.0)
Hemoglobin: 15.6 g/dL (ref 13.0–17.0)
Lymphocytes Relative: 38.4 % (ref 12.0–46.0)
Lymphs Abs: 2.1 10*3/uL (ref 0.7–4.0)
MCHC: 32.6 g/dL (ref 30.0–36.0)
MCV: 94 fl (ref 78.0–100.0)
Monocytes Absolute: 0.5 10*3/uL (ref 0.1–1.0)
Monocytes Relative: 8.8 % (ref 3.0–12.0)
Neutro Abs: 2.8 10*3/uL (ref 1.4–7.7)
Neutrophils Relative %: 49.4 % (ref 43.0–77.0)
Platelets: 211 10*3/uL (ref 150.0–400.0)
RBC: 5.09 Mil/uL (ref 4.22–5.81)
RDW: 13.9 % (ref 11.5–15.5)
WBC: 5.6 10*3/uL (ref 4.0–10.5)

## 2023-01-16 LAB — HEMOGLOBIN A1C: Hgb A1c MFr Bld: 6.5 % (ref 4.6–6.5)

## 2023-01-16 LAB — TSH: TSH: 1.53 u[IU]/mL (ref 0.35–5.50)

## 2023-01-16 MED ORDER — PANTOPRAZOLE SODIUM 40 MG PO TBEC: 40.0000 mg | DELAYED_RELEASE_TABLET | Freq: Every morning | ORAL | 3 refills | Status: DC

## 2023-01-16 MED ORDER — FUROSEMIDE 20 MG PO TABS: 20.0000 mg | ORAL_TABLET | Freq: Every day | ORAL | 5 refills | Status: AC | PRN

## 2023-01-16 MED ORDER — FAMOTIDINE 40 MG PO TABS: 40.0000 mg | ORAL_TABLET | Freq: Every evening | ORAL | 3 refills | Status: DC

## 2023-01-16 NOTE — Progress Notes (Signed)
   Subjective:    Patient ID: Jesse Hanna, male    DOB: [birth year], 57 y.o.   MRN: [id number]  HPI Here for refills and to ask about swelling in his feet and ankles. He first noticed this swelling several months ago. This is not painful. He denies any SOB. His BP has been stable.    Review of Systems  Constitutional: Negative.   Respiratory: Negative.    Cardiovascular:  Positive for leg swelling. Negative for chest pain and palpitations.       Objective:   Physical Exam Constitutional:      Appearance: Normal appearance.  Cardiovascular:     Rate and Rhythm: Normal rate and regular rhythm.     Pulses: Normal pulses.     Heart sounds: Normal heart sounds.  Pulmonary:     Effort: Pulmonary effort is normal.     Breath sounds: Normal breath sounds.  Musculoskeletal:     Comments: 1+ edema in both ankles . He has numerous varicosities in both lower legs of various sizes   Neurological:     Mental Status: He is alert.           Assessment & Plan:  LE edema, in part due to varicose veins. He will try Lasix 20 mg daily as needed for fluid. We will get labs to check renal function, etc. His HTN is well controlled.  Gershon Crane, MD

## 2023-01-17 ENCOUNTER — Telehealth: Payer: Self-pay | Admitting: Family Medicine

## 2023-01-17 NOTE — Telephone Encounter (Signed)
Pt was just seen by MD yesterday - 01/16/23.  Pt says he received his Furosemide and his Pantoprazole, but not his BP medication.   Pt states he cannot remember the name of this medication.   Please advise.

## 2023-01-18 ENCOUNTER — Other Ambulatory Visit: Payer: Self-pay

## 2023-01-18 DIAGNOSIS — I10 Essential (primary) hypertension: Secondary | ICD-10-CM

## 2023-01-18 MED ORDER — METOPROLOL SUCCINATE ER 50 MG PO TB24
50.0000 mg | ORAL_TABLET | Freq: Every day | ORAL | 0 refills | Status: AC
Start: 2023-01-18 — End: ?

## 2023-01-18 NOTE — Telephone Encounter (Signed)
Rx sent 

## 2023-01-25 DIAGNOSIS — M62830 Muscle spasm of back: Secondary | ICD-10-CM | POA: Diagnosis not present

## 2023-01-25 DIAGNOSIS — G4700 Insomnia, unspecified: Secondary | ICD-10-CM | POA: Diagnosis not present

## 2023-01-25 DIAGNOSIS — M47817 Spondylosis without myelopathy or radiculopathy, lumbosacral region: Secondary | ICD-10-CM | POA: Diagnosis not present

## 2023-01-25 DIAGNOSIS — G894 Chronic pain syndrome: Secondary | ICD-10-CM | POA: Diagnosis not present

## 2023-02-22 DIAGNOSIS — G4700 Insomnia, unspecified: Secondary | ICD-10-CM | POA: Diagnosis not present

## 2023-02-22 DIAGNOSIS — M47817 Spondylosis without myelopathy or radiculopathy, lumbosacral region: Secondary | ICD-10-CM | POA: Diagnosis not present

## 2023-02-22 DIAGNOSIS — G894 Chronic pain syndrome: Secondary | ICD-10-CM | POA: Diagnosis not present

## 2023-02-22 DIAGNOSIS — M62830 Muscle spasm of back: Secondary | ICD-10-CM | POA: Diagnosis not present

## 2023-03-22 DIAGNOSIS — G4700 Insomnia, unspecified: Secondary | ICD-10-CM | POA: Diagnosis not present

## 2023-03-22 DIAGNOSIS — G894 Chronic pain syndrome: Secondary | ICD-10-CM | POA: Diagnosis not present

## 2023-03-22 DIAGNOSIS — M62830 Muscle spasm of back: Secondary | ICD-10-CM | POA: Diagnosis not present

## 2023-03-22 DIAGNOSIS — M47817 Spondylosis without myelopathy or radiculopathy, lumbosacral region: Secondary | ICD-10-CM | POA: Diagnosis not present

## 2023-04-16 ENCOUNTER — Telehealth: Payer: Self-pay | Admitting: Family Medicine

## 2023-04-16 MED ORDER — ALPRAZOLAM 1 MG PO TABS: 1.0000 mg | ORAL_TABLET | Freq: Three times a day (TID) | ORAL | 5 refills | Status: DC | PRN

## 2023-04-16 NOTE — Telephone Encounter (Signed)
Done

## 2023-04-16 NOTE — Telephone Encounter (Signed)
Prescription Request  04/16/2023  LOV: 01/16/2023  What is the name of the medication or equipment? ALPRAZolam Prudy Feeler) 1 MG tablet   Have you contacted your pharmacy to request a refill? No   Which pharmacy would you like this sent to?   [id number] [facility], [place] - [address] [place] [address] Phone: [phone] Fax: [phone]    Patient notified that their request is being sent to the clinical staff for review and that they should receive a response within 2 business days.   Please advise at Mobile [phone] (mobile)

## 2023-04-16 NOTE — Telephone Encounter (Signed)
Pt LOV was on 01/16/23 Last refill was done on 11/01/22 Please advise

## 2023-04-17 NOTE — Telephone Encounter (Signed)
Noted  

## 2023-04-19 DIAGNOSIS — G894 Chronic pain syndrome: Secondary | ICD-10-CM | POA: Diagnosis not present

## 2023-04-19 DIAGNOSIS — M62830 Muscle spasm of back: Secondary | ICD-10-CM | POA: Diagnosis not present

## 2023-04-19 DIAGNOSIS — Z79891 Long term (current) use of opiate analgesic: Secondary | ICD-10-CM | POA: Diagnosis not present

## 2023-04-19 DIAGNOSIS — G4700 Insomnia, unspecified: Secondary | ICD-10-CM | POA: Diagnosis not present

## 2023-04-19 DIAGNOSIS — M47817 Spondylosis without myelopathy or radiculopathy, lumbosacral region: Secondary | ICD-10-CM | POA: Diagnosis not present

## 2023-05-28 DIAGNOSIS — G894 Chronic pain syndrome: Secondary | ICD-10-CM | POA: Diagnosis not present

## 2023-05-28 DIAGNOSIS — G4700 Insomnia, unspecified: Secondary | ICD-10-CM | POA: Diagnosis not present

## 2023-05-28 DIAGNOSIS — M62830 Muscle spasm of back: Secondary | ICD-10-CM | POA: Diagnosis not present

## 2023-05-28 DIAGNOSIS — M47817 Spondylosis without myelopathy or radiculopathy, lumbosacral region: Secondary | ICD-10-CM | POA: Diagnosis not present

## 2023-06-25 DIAGNOSIS — G4700 Insomnia, unspecified: Secondary | ICD-10-CM | POA: Diagnosis not present

## 2023-06-25 DIAGNOSIS — G894 Chronic pain syndrome: Secondary | ICD-10-CM | POA: Diagnosis not present

## 2023-06-25 DIAGNOSIS — M47817 Spondylosis without myelopathy or radiculopathy, lumbosacral region: Secondary | ICD-10-CM | POA: Diagnosis not present

## 2023-06-25 DIAGNOSIS — M62830 Muscle spasm of back: Secondary | ICD-10-CM | POA: Diagnosis not present

## 2023-07-23 DIAGNOSIS — M62830 Muscle spasm of back: Secondary | ICD-10-CM | POA: Diagnosis not present

## 2023-07-23 DIAGNOSIS — G894 Chronic pain syndrome: Secondary | ICD-10-CM | POA: Diagnosis not present

## 2023-07-23 DIAGNOSIS — M47817 Spondylosis without myelopathy or radiculopathy, lumbosacral region: Secondary | ICD-10-CM | POA: Diagnosis not present

## 2023-07-23 DIAGNOSIS — G4700 Insomnia, unspecified: Secondary | ICD-10-CM | POA: Diagnosis not present

## 2023-08-20 ENCOUNTER — Other Ambulatory Visit: Payer: Self-pay | Admitting: Family Medicine

## 2023-08-20 DIAGNOSIS — M47817 Spondylosis without myelopathy or radiculopathy, lumbosacral region: Secondary | ICD-10-CM | POA: Diagnosis not present

## 2023-08-20 DIAGNOSIS — G894 Chronic pain syndrome: Secondary | ICD-10-CM | POA: Diagnosis not present

## 2023-08-20 DIAGNOSIS — M62830 Muscle spasm of back: Secondary | ICD-10-CM | POA: Diagnosis not present

## 2023-08-20 DIAGNOSIS — G4700 Insomnia, unspecified: Secondary | ICD-10-CM | POA: Diagnosis not present

## 2023-09-18 DIAGNOSIS — G894 Chronic pain syndrome: Secondary | ICD-10-CM | POA: Diagnosis not present

## 2023-09-18 DIAGNOSIS — M47817 Spondylosis without myelopathy or radiculopathy, lumbosacral region: Secondary | ICD-10-CM | POA: Diagnosis not present

## 2023-09-18 DIAGNOSIS — M62830 Muscle spasm of back: Secondary | ICD-10-CM | POA: Diagnosis not present

## 2023-09-18 DIAGNOSIS — G4700 Insomnia, unspecified: Secondary | ICD-10-CM | POA: Diagnosis not present

## 2023-09-26 ENCOUNTER — Other Ambulatory Visit: Payer: Self-pay | Admitting: Family Medicine

## 2023-09-27 NOTE — Telephone Encounter (Signed)
Copied from CRM [id number]. Topic: Clinical - Medication Question >> Sep 27, 2023  1:18 PM Gurney Maxin H wrote: Reason for CRM: Patient checking on status of refill for his ALPRAZolam Prudy Feeler) 1 MG tablet, agent advised patient that refill is awaiting provider approval. Patient wants to know if the office could send him a message in Mychart when the prescription is sent to pharmacy. Agent advised patient pharmacy should reach out but patient wants to be notified by office when it's sent.

## 2023-09-28 ENCOUNTER — Telehealth: Payer: Self-pay | Admitting: Family Medicine

## 2023-09-28 MED ORDER — ALPRAZOLAM 1 MG PO TABS: 1.0000 mg | ORAL_TABLET | Freq: Three times a day (TID) | ORAL | 5 refills | Status: DC | PRN

## 2023-09-28 NOTE — Telephone Encounter (Signed)
 Done

## 2023-09-28 NOTE — Telephone Encounter (Signed)
   Copied from CRM [id number]. Topic: Clinical - Medication Refill >> Sep 28, 2023  3:31 PM Irine Seal wrote: Most Recent Primary Care Visit:  Provider: Gershon Crane A  Department: [facility]  Visit Type: OFFICE VISIT  Date: 01/16/2023   Medication: ALPRAZolam Prudy Feeler) 1 MG tablet [[id number]]    Has the patient contacted their pharmacy? Yes (Agent: If no, request that the patient contact the pharmacy for the refill. If patient does not wish to contact the pharmacy document the reason why and proceed with request.) (Agent: If yes, when and what did the pharmacy advise?) they said they have not gotten anything    Is this the correct pharmacy for this prescription? Yes If no, delete pharmacy and type the correct one.  This is the patient's preferred pharmacy:      [phone] [facility], [place] - [address] [place] [address] Phone: [phone] Fax: [phone]     Has the prescription been filled recently? No   Is the patient out of the medication? Yes   Has the patient been seen for an appointment in the last year OR does the patient have an upcoming appointment? Yes   Can we respond through MyChart? Yes   Agent: Please be advised that Rx refills may take up to 3 business days. We ask that you follow-up with your pharmacy.

## 2023-09-28 NOTE — Telephone Encounter (Signed)
LOV was on 01/16/23 Please advise

## 2023-09-28 NOTE — Telephone Encounter (Signed)
Copied from CRM [id number]. Topic: Clinical - Medication Refill >> Sep 28, 2023  3:31 PM Irine Seal wrote: Most Recent Primary Care Visit:  Provider: Gershon Crane A  Department: [facility]  Visit Type: OFFICE VISIT  Date: 01/16/2023  Medication: ALPRAZolam Prudy Feeler) 1 MG tablet [[id number]]   Has the patient contacted their pharmacy? Yes (Agent: If no, request that the patient contact the pharmacy for the refill. If patient does not wish to contact the pharmacy document the reason why and proceed with request.) (Agent: If yes, when and what did the pharmacy advise?) they said they have not gotten anything   Is this the correct pharmacy for this prescription? Yes If no, delete pharmacy and type the correct one.  This is the patient's preferred pharmacy:    [phone] [place], [place] - [address] [place] [address] Phone: [phone] Fax: [phone]   Has the prescription been filled recently? No  Is the patient out of the medication? Yes  Has the patient been seen for an appointment in the last year OR does the patient have an upcoming appointment? Yes  Can we respond through MyChart? Yes  Agent: Please be advised that Rx refills may take up to 3 business days. We ask that you follow-up with your pharmacy.

## 2023-10-17 DIAGNOSIS — M62830 Muscle spasm of back: Secondary | ICD-10-CM | POA: Diagnosis not present

## 2023-10-17 DIAGNOSIS — G894 Chronic pain syndrome: Secondary | ICD-10-CM | POA: Diagnosis not present

## 2023-10-17 DIAGNOSIS — G4700 Insomnia, unspecified: Secondary | ICD-10-CM | POA: Diagnosis not present

## 2023-10-17 DIAGNOSIS — M47817 Spondylosis without myelopathy or radiculopathy, lumbosacral region: Secondary | ICD-10-CM | POA: Diagnosis not present

## 2023-11-15 DIAGNOSIS — M62830 Muscle spasm of back: Secondary | ICD-10-CM | POA: Diagnosis not present

## 2023-11-15 DIAGNOSIS — G894 Chronic pain syndrome: Secondary | ICD-10-CM | POA: Diagnosis not present

## 2023-11-15 DIAGNOSIS — M47817 Spondylosis without myelopathy or radiculopathy, lumbosacral region: Secondary | ICD-10-CM | POA: Diagnosis not present

## 2023-11-15 DIAGNOSIS — G4700 Insomnia, unspecified: Secondary | ICD-10-CM | POA: Diagnosis not present

## 2023-12-13 DIAGNOSIS — M62830 Muscle spasm of back: Secondary | ICD-10-CM | POA: Diagnosis not present

## 2023-12-13 DIAGNOSIS — G894 Chronic pain syndrome: Secondary | ICD-10-CM | POA: Diagnosis not present

## 2023-12-13 DIAGNOSIS — M47817 Spondylosis without myelopathy or radiculopathy, lumbosacral region: Secondary | ICD-10-CM | POA: Diagnosis not present

## 2023-12-13 DIAGNOSIS — G4700 Insomnia, unspecified: Secondary | ICD-10-CM | POA: Diagnosis not present

## 2023-12-18 ENCOUNTER — Encounter: Payer: Self-pay | Admitting: Family Medicine

## 2023-12-18 ENCOUNTER — Ambulatory Visit (INDEPENDENT_AMBULATORY_CARE_PROVIDER_SITE_OTHER): Admitting: Family Medicine

## 2023-12-18 VITALS — BP 124/80 | HR 85 | Temp 98.1°F | Ht 71.0 in | Wt 216.0 lb

## 2023-12-18 DIAGNOSIS — E538 Deficiency of other specified B group vitamins: Secondary | ICD-10-CM | POA: Diagnosis not present

## 2023-12-18 DIAGNOSIS — J019 Acute sinusitis, unspecified: Secondary | ICD-10-CM

## 2023-12-18 DIAGNOSIS — R739 Hyperglycemia, unspecified: Secondary | ICD-10-CM | POA: Insufficient documentation

## 2023-12-18 DIAGNOSIS — G629 Polyneuropathy, unspecified: Secondary | ICD-10-CM | POA: Insufficient documentation

## 2023-12-18 LAB — BASIC METABOLIC PANEL WITH GFR
BUN: 12 mg/dL (ref 6–23)
CO2: 32 meq/L (ref 19–32)
Calcium: 10 mg/dL (ref 8.4–10.5)
Chloride: 98 meq/L (ref 96–112)
Creatinine, Ser: 0.91 mg/dL (ref 0.40–1.50)
GFR: 93.02 mL/min (ref >60.00)
Glucose, Bld: 152 mg/dL — ABNORMAL HIGH (ref 70–99)
Potassium: 4.9 meq/L (ref 3.5–5.1)
Sodium: 139 meq/L (ref 135–145)

## 2023-12-18 LAB — CBC WITH DIFFERENTIAL/PLATELET
Basophils Absolute: 0.1 10*3/uL (ref 0.0–0.1)
Basophils Relative: 1.2 % (ref 0.0–3.0)
Eosinophils Absolute: 0.1 10*3/uL (ref 0.0–0.7)
Eosinophils Relative: 1.8 % (ref 0.0–5.0)
HCT: 50.9 % (ref 39.0–52.0)
Hemoglobin: 17 g/dL (ref 13.0–17.0)
Lymphocytes Relative: 27.5 % (ref 12.0–46.0)
Lymphs Abs: 1.5 10*3/uL (ref 0.7–4.0)
MCHC: 33.4 g/dL (ref 30.0–36.0)
MCV: 93.8 fl (ref 78.0–100.0)
Monocytes Absolute: 0.4 10*3/uL (ref 0.1–1.0)
Monocytes Relative: 6.7 % (ref 3.0–12.0)
Neutro Abs: 3.3 10*3/uL (ref 1.4–7.7)
Neutrophils Relative %: 62.8 % (ref 43.0–77.0)
Platelets: 246 10*3/uL (ref 150.0–400.0)
RBC: 5.43 Mil/uL (ref 4.22–5.81)
RDW: 14.4 % (ref 11.5–15.5)
WBC: 5.3 10*3/uL (ref 4.0–10.5)

## 2023-12-18 LAB — VITAMIN B12: Vitamin B-12: 398 pg/mL (ref 211–911)

## 2023-12-18 LAB — TSH: TSH: 2.68 u[IU]/mL (ref 0.35–5.50)

## 2023-12-18 LAB — HEMOGLOBIN A1C: Hgb A1c MFr Bld: 6 % (ref 4.6–6.5)

## 2023-12-18 MED ORDER — AMOXICILLIN-POT CLAVULANATE 875-125 MG PO TABS: 1.0000 | ORAL_TABLET | Freq: Two times a day (BID) | ORAL | 0 refills | Status: DC

## 2023-12-18 MED ORDER — CYANOCOBALAMIN 1000 MCG/ML IJ SOLN
1000.0000 ug | Freq: Once | INTRAMUSCULAR | Status: AC
  Administered 2023-12-18: 1000 ug via INTRAMUSCULAR

## 2023-12-18 MED ORDER — ALBUTEROL SULFATE HFA 108 (90 BASE) MCG/ACT IN AERS: 2.0000 | INHALATION_SPRAY | RESPIRATORY_TRACT | 11 refills | Status: AC | PRN

## 2023-12-18 NOTE — Addendum Note (Signed)
 Addended by: Philbert Brave on: 12/18/2023 09:12 AM   Modules accepted: Orders

## 2023-12-18 NOTE — Progress Notes (Signed)
   Subjective:    Patient ID: Jesse Hanna, male    DOB: 1966/02/13, 58 y.o.   MRN: [id number]  HPI Here for several issues. First his neuropathy has been flaring up again the past 4 weeks. He has burning pains and tingling in both feet and both hands. He took B12 shots for years until he stopped these in 2009. He had been taking Gabapentin from his pain management provider, but he has tapered himself off this due to weight gain. He also complains of one week of sinus pressure, blowing green mucus from the nose, and PND. No fever or cough.    Review of Systems  Constitutional: Negative.   HENT:  Positive for congestion, postnasal drip, rhinorrhea and sinus pressure. Negative for ear pain and sore throat.   Eyes: Negative.   Respiratory: Negative.    Cardiovascular: Negative.        Objective:   Physical Exam Constitutional:      Appearance: Normal appearance.  HENT:     Right Ear: Tympanic membrane, ear canal and external ear normal.     Left Ear: Tympanic membrane, ear canal and external ear normal.     Nose: Nose normal.     Mouth/Throat:     Pharynx: Oropharynx is clear.  Eyes:     Conjunctiva/sclera: Conjunctivae normal.  Pulmonary:     Effort: Pulmonary effort is normal.     Breath sounds: Normal breath sounds.  Musculoskeletal:     Right lower leg: No edema.     Left lower leg: No edema.  Lymphadenopathy:     Cervical: No cervical adenopathy.  Neurological:     Mental Status: He is alert.           Assessment & Plan:  His neuropathy has become a problem again, and this is likelt due to a combination of low B12 levels and coming off Gabapentin. We will check labs today including a B2 level and an A1c. Given a B12 shot today. Treat the sinusitis with 10 days of Augmentin .  Corita Diego, MD

## 2023-12-19 MED ORDER — CYANOCOBALAMIN 1000 MCG/ML IJ SOLN: 1000.0000 ug | INTRAMUSCULAR | 11 refills | Status: AC

## 2023-12-19 MED ORDER — SYRINGE 25G X 1" 3 ML MISC: 1.0000 | 5 refills | Status: AC

## 2023-12-19 NOTE — Addendum Note (Signed)
 Addended by: Corita Diego A on: 12/19/2023 12:55 PM   Modules accepted: Orders

## 2024-01-03 ENCOUNTER — Encounter: Admitting: Family Medicine

## 2024-01-10 DIAGNOSIS — G4700 Insomnia, unspecified: Secondary | ICD-10-CM | POA: Diagnosis not present

## 2024-01-10 DIAGNOSIS — M47817 Spondylosis without myelopathy or radiculopathy, lumbosacral region: Secondary | ICD-10-CM | POA: Diagnosis not present

## 2024-01-10 DIAGNOSIS — G894 Chronic pain syndrome: Secondary | ICD-10-CM | POA: Diagnosis not present

## 2024-01-10 DIAGNOSIS — M62830 Muscle spasm of back: Secondary | ICD-10-CM | POA: Diagnosis not present

## 2024-01-22 ENCOUNTER — Encounter: Admitting: Family Medicine

## 2024-01-28 ENCOUNTER — Ambulatory Visit (INDEPENDENT_AMBULATORY_CARE_PROVIDER_SITE_OTHER): Admitting: Family Medicine

## 2024-01-28 ENCOUNTER — Encounter: Payer: Self-pay | Admitting: Family Medicine

## 2024-01-28 VITALS — BP 124/86 | HR 88 | Temp 98.2°F | Ht 71.0 in | Wt 221.0 lb

## 2024-01-28 DIAGNOSIS — N138 Other obstructive and reflux uropathy: Secondary | ICD-10-CM | POA: Diagnosis not present

## 2024-01-28 DIAGNOSIS — I83893 Varicose veins of bilateral lower extremities with other complications: Secondary | ICD-10-CM | POA: Diagnosis not present

## 2024-01-28 DIAGNOSIS — R739 Hyperglycemia, unspecified: Secondary | ICD-10-CM

## 2024-01-28 DIAGNOSIS — E291 Testicular hypofunction: Secondary | ICD-10-CM

## 2024-01-28 DIAGNOSIS — E538 Deficiency of other specified B group vitamins: Secondary | ICD-10-CM

## 2024-01-28 DIAGNOSIS — L409 Psoriasis, unspecified: Secondary | ICD-10-CM

## 2024-01-28 DIAGNOSIS — G629 Polyneuropathy, unspecified: Secondary | ICD-10-CM

## 2024-01-28 DIAGNOSIS — F411 Generalized anxiety disorder: Secondary | ICD-10-CM

## 2024-01-28 DIAGNOSIS — Z23 Encounter for immunization: Secondary | ICD-10-CM

## 2024-01-28 DIAGNOSIS — I10 Essential (primary) hypertension: Secondary | ICD-10-CM

## 2024-01-28 DIAGNOSIS — N401 Enlarged prostate with lower urinary tract symptoms: Secondary | ICD-10-CM | POA: Diagnosis not present

## 2024-01-28 DIAGNOSIS — Z1211 Encounter for screening for malignant neoplasm of colon: Secondary | ICD-10-CM

## 2024-01-28 DIAGNOSIS — K219 Gastro-esophageal reflux disease without esophagitis: Secondary | ICD-10-CM

## 2024-01-28 DIAGNOSIS — M546 Pain in thoracic spine: Secondary | ICD-10-CM

## 2024-01-28 LAB — LIPID PANEL
Cholesterol: 188 mg/dL (ref 0–200)
HDL: 41.8 mg/dL (ref >39.00)
LDL Cholesterol: 130 mg/dL — ABNORMAL HIGH (ref 0–99)
NonHDL: 146.64
Total CHOL/HDL Ratio: 5
Triglycerides: 81 mg/dL (ref 0.0–149.0)
VLDL: 16.2 mg/dL (ref 0.0–40.0)

## 2024-01-28 LAB — TESTOSTERONE: Testosterone: 136.91 ng/dL — ABNORMAL LOW (ref 300.00–890.00)

## 2024-01-28 LAB — PSA: PSA: 0.1 ng/mL (ref 0.10–4.00)

## 2024-01-28 MED ORDER — BETAMETHASONE DIPROPIONATE 0.05 % EX CREA: TOPICAL_CREAM | Freq: Two times a day (BID) | CUTANEOUS | 11 refills | Status: AC

## 2024-01-28 MED ORDER — ALPRAZOLAM 1 MG PO TABS: 1.0000 mg | ORAL_TABLET | Freq: Three times a day (TID) | ORAL | 5 refills | Status: DC | PRN

## 2024-01-28 NOTE — Progress Notes (Signed)
 Subjective:    Patient ID: Jesse Hanna, male    DOB: [birth year], 58 y.o.   MRN: [id number]  HPI Here to follow up on issues. We saw him recently for neuropathy in the hands and feet causing him to have burning pains. His B12 level that day was 398, but we knew he does better at higher levels, so we started him back on B12 shots. After the first shot he says he felt so much better. His back pain has been stable. His HTN and anxiety are well controlled. His psoriasis has flared up causing itchy rashes on his trunk.    Review of Systems  Constitutional: Negative.   HENT: Negative.    Eyes: Negative.   Respiratory: Negative.    Cardiovascular: Negative.   Gastrointestinal: Negative.   Genitourinary: Negative.   Musculoskeletal:  Positive for back pain.  Skin:  Positive for rash.  Neurological: Negative.   Psychiatric/Behavioral: Negative.         Objective:   Physical Exam Constitutional:      General: He is not in acute distress.    Appearance: Normal appearance. He is well-developed. He is not diaphoretic.  HENT:     Head: Normocephalic and atraumatic.     Right Ear: External ear normal.     Left Ear: External ear normal.     Nose: Nose normal.     Mouth/Throat:     Pharynx: No oropharyngeal exudate.  Eyes:     General: No scleral icterus.       Right eye: No discharge.        Left eye: No discharge.     Conjunctiva/sclera: Conjunctivae normal.     Pupils: Pupils are equal, round, and reactive to light.  Neck:     Thyroid : No thyromegaly.     Vascular: No JVD.     Trachea: No tracheal deviation.  Cardiovascular:     Rate and Rhythm: Normal rate and regular rhythm.     Pulses: Normal pulses.     Heart sounds: Normal heart sounds. No murmur heard.    No friction rub. No gallop.  Pulmonary:     Effort: Pulmonary effort is normal. No respiratory distress.     Breath sounds: Normal breath sounds. No wheezing or rales.  Chest:     Chest wall: No tenderness.   Abdominal:     General: Bowel sounds are normal. There is no distension.     Palpations: Abdomen is soft. There is no mass.     Tenderness: There is no abdominal tenderness. There is no guarding or rebound.  Genitourinary:    Penis: Normal. No tenderness.      Testes: Normal.     Prostate: Normal.     Rectum: Normal. Guaiac result negative.  Musculoskeletal:        General: No tenderness. Normal range of motion.     Cervical back: Neck supple.  Lymphadenopathy:     Cervical: No cervical adenopathy.  Skin:    General: Skin is warm and dry.     Coloration: Skin is not pale.     Comments: Multiple small red scaly patches are scattered over his trunk and on both knees   Neurological:     General: No focal deficit present.     Mental Status: He is alert and oriented to person, place, and time.     Cranial Nerves: No cranial nerve deficit.     Motor: No abnormal muscle tone.  Coordination: Coordination normal.     Deep Tendon Reflexes: Reflexes are normal and symmetric. Reflexes normal.  Psychiatric:        Mood and Affect: Mood normal.        Behavior: Behavior normal.        Thought Content: Thought content normal.        Judgment: Judgment normal.           Assessment & Plan:  His anxiety and HTN are stable. His back pain is stable. His neuropathy is responding to the B12 shots. We will get labs to check lipids, testosterone , and an A1c. Treat the psoriasis with Betamethasone cream. Set up another colonoscopy. We spent a total of ( 33  ) minutes reviewing records and discussing these issues.  Corita Diego, MD

## 2024-01-29 ENCOUNTER — Telehealth: Payer: Self-pay

## 2024-01-29 ENCOUNTER — Other Ambulatory Visit (HOSPITAL_COMMUNITY): Payer: Self-pay

## 2024-01-29 ENCOUNTER — Ambulatory Visit: Payer: Self-pay | Admitting: Family Medicine

## 2024-01-29 MED ORDER — TESTOSTERONE CYPIONATE 200 MG/ML IM SOLN: 200.0000 mg | INTRAMUSCULAR | 1 refills | Status: AC

## 2024-01-29 MED ORDER — SYRINGE 22G X 1-1/2" 3 ML MISC: 1.0000 | 1 refills | Status: DC

## 2024-01-29 NOTE — Addendum Note (Signed)
 Addended by: Corita Diego A on: 01/29/2024 10:54 AM   Modules accepted: Orders

## 2024-01-29 NOTE — Telephone Encounter (Signed)
 Pharmacy Patient Advocate Encounter   Received notification from CoverMyMeds that prior authorization for Testosterone  Cypionate 200MG /ML intramuscular solution is required/requested.   Insurance verification completed.   The patient is insured through [facility] .   Per test claim: PA required; PA started via CoverMyMeds. KEY [id number] . Waiting for clinical questions to populate.

## 2024-01-30 ENCOUNTER — Other Ambulatory Visit: Payer: Self-pay | Admitting: Family Medicine

## 2024-01-30 NOTE — Telephone Encounter (Signed)
 PLEASE BE ADVISED Clinical questions have been answered and PA submitted.TO PLAN. PA currently Pending.

## 2024-01-30 NOTE — Telephone Encounter (Signed)
 Patient medication has been approved as of today for a year information regarding this will bw faxed over

## 2024-01-31 NOTE — Telephone Encounter (Signed)
 Pharmacy Patient Advocate Encounter  Received notification from [facility] that Prior Authorization for Testosterone  Cypionate 200MG /ML intramuscular solution  has been APPROVED from 01/30/2024 to 01/29/2025 SEE APPROVAL OUTCOME BELOW Outcome Approved on June 4 by [facility] Approved. Effective Date: 01/30/2024 Authorization Expiration Date: 01/29/2025  PA #/Case ID/Reference #:  [id number]

## 2024-02-02 ENCOUNTER — Encounter: Payer: Self-pay | Admitting: Family Medicine

## 2024-02-07 DIAGNOSIS — M47817 Spondylosis without myelopathy or radiculopathy, lumbosacral region: Secondary | ICD-10-CM | POA: Diagnosis not present

## 2024-02-07 DIAGNOSIS — M62830 Muscle spasm of back: Secondary | ICD-10-CM | POA: Diagnosis not present

## 2024-02-07 DIAGNOSIS — G4700 Insomnia, unspecified: Secondary | ICD-10-CM | POA: Diagnosis not present

## 2024-02-07 DIAGNOSIS — G894 Chronic pain syndrome: Secondary | ICD-10-CM | POA: Diagnosis not present

## 2024-02-09 ENCOUNTER — Other Ambulatory Visit: Payer: Self-pay | Admitting: Family Medicine

## 2024-03-04 DIAGNOSIS — G4700 Insomnia, unspecified: Secondary | ICD-10-CM | POA: Diagnosis not present

## 2024-03-04 DIAGNOSIS — M47817 Spondylosis without myelopathy or radiculopathy, lumbosacral region: Secondary | ICD-10-CM | POA: Diagnosis not present

## 2024-03-04 DIAGNOSIS — M62830 Muscle spasm of back: Secondary | ICD-10-CM | POA: Diagnosis not present

## 2024-03-04 DIAGNOSIS — G894 Chronic pain syndrome: Secondary | ICD-10-CM | POA: Diagnosis not present

## 2024-03-05 ENCOUNTER — Other Ambulatory Visit: Payer: Self-pay

## 2024-03-05 ENCOUNTER — Emergency Department (HOSPITAL_BASED_OUTPATIENT_CLINIC_OR_DEPARTMENT_OTHER)

## 2024-03-05 ENCOUNTER — Emergency Department (HOSPITAL_BASED_OUTPATIENT_CLINIC_OR_DEPARTMENT_OTHER): Admission: EM | Admit: 2024-03-05 | Discharge: 2024-03-05 | Disposition: A | Discharge status: Home / Self Care

## 2024-03-05 ENCOUNTER — Encounter (HOSPITAL_BASED_OUTPATIENT_CLINIC_OR_DEPARTMENT_OTHER): Payer: Self-pay | Admitting: Emergency Medicine

## 2024-03-05 DIAGNOSIS — M545 Low back pain, unspecified: Secondary | ICD-10-CM

## 2024-03-05 DIAGNOSIS — Z7982 Long term (current) use of aspirin: Secondary | ICD-10-CM | POA: Insufficient documentation

## 2024-03-05 DIAGNOSIS — X500XXA Overexertion from strenuous movement or load, initial encounter: Secondary | ICD-10-CM | POA: Insufficient documentation

## 2024-03-05 DIAGNOSIS — M5459 Other low back pain: Secondary | ICD-10-CM | POA: Diagnosis not present

## 2024-03-05 DIAGNOSIS — M549 Dorsalgia, unspecified: Secondary | ICD-10-CM | POA: Diagnosis not present

## 2024-03-05 DIAGNOSIS — M4326 Fusion of spine, lumbar region: Secondary | ICD-10-CM | POA: Diagnosis not present

## 2024-03-05 DIAGNOSIS — Z981 Arthrodesis status: Secondary | ICD-10-CM

## 2024-03-05 MED ORDER — PREDNISONE 50 MG PO TABS
60.0000 mg | ORAL_TABLET | Freq: Once | ORAL | Status: AC
  Administered 2024-03-05: 60 mg via ORAL
  Filled 2024-03-05: qty 1

## 2024-03-05 MED ORDER — KETOROLAC TROMETHAMINE 30 MG/ML IJ SOLN
30.0000 mg | Freq: Once | INTRAMUSCULAR | Status: AC
  Administered 2024-03-05: 30 mg via INTRAMUSCULAR
  Filled 2024-03-05: qty 1

## 2024-03-05 MED ORDER — PREDNISONE 20 MG PO TABS: 40.0000 mg | ORAL_TABLET | Freq: Every day | ORAL | 0 refills | Status: AC

## 2024-03-05 NOTE — Discharge Instructions (Addendum)
 Your x-ray shows a fracture of the rod in your left lower back.   Please call Dr. Iran office tomorrow to schedule a follow-up appointment.

## 2024-03-05 NOTE — ED Provider Notes (Signed)
 [facility] Provider Note   CSN: [id number] Arrival date & time: 03/05/24  1618     Patient presents with: Back Pain   Jesse Hanna is a 58 y.o. male.   Patient presents to the emergency department today for evaluation of acute on chronic back pain.  Patient has a history of multiple cervical and lumbar surgeries.  Patient states that today he stood up after pick up a laundry basket and felt a very powerful pop in his left lower back.  He had immediate pain that shot up his back.  Uncontrolled with home pain medication.  No new numbness or tingling in the legs (has had some numbness chronically in right leg).  No new weakness.  Patient denies warning symptoms of back pain including: fecal incontinence, urinary retention or overflow incontinence, night sweats, waking from sleep with back pain, unexplained fevers or weight loss, h/o cancer, IVDU, recent trauma.          Prior to Admission medications   Medication Sig Start Date End Date Taking? Authorizing Provider  albuterol  (VENTOLIN  HFA) 108 (90 Base) MCG/ACT inhaler Inhale 2 puffs into the lungs every 4 (four) hours as needed for wheezing or shortness of breath. 12/18/23   [name] LABOR, MD  ALPRAZolam  (XANAX ) 1 MG tablet Take 1 tablet (1 mg total) by mouth 3 (three) times daily as needed for anxiety. 01/28/24   [name] LABOR, MD  amitriptyline (ELAVIL) 75 MG tablet Take 75 mg by mouth at bedtime.    [provider]  aspirin  EC 81 MG tablet Take 1 tablet (81 mg total) by mouth daily. Swallow whole. 05/17/22   [name] LABOR, MD  betamethasone  dipropionate 0.05 % cream Apply topically 2 (two) times daily. 01/28/24   [name] LABOR, MD  cyanocobalamin  (VITAMIN B12) 1000 MCG/ML injection Inject 1 mL (1,000 mcg total) into the muscle every 14 (fourteen) days. 12/19/23   [name] LABOR, MD  EASY TOUCH FLIPLOCK NEEDLES 22G X 1-1/2 MISC USE EVERY 14 DAYS AS DIRECTED. 01/30/24   [name] LABOR, MD   famotidine  (PEPCID ) 40 MG tablet TAKE 1 TABLET BY MOUTH EVERY DAY IN THE EVENING 02/11/24   [name] LABOR, MD  furosemide  (LASIX ) 20 MG tablet Take 1 tablet (20 mg total) by mouth daily as needed for fluid. 01/16/23   [name] LABOR, MD  methocarbamol  (ROBAXIN ) 750 MG tablet Take 750 mg by mouth 4 (four) times daily.    [provider]  metoprolol  succinate (TOPROL -XL) 50 MG 24 hr tablet Take 1 tablet (50 mg total) by mouth daily. Take with or immediately following a meal. 01/18/23   [name] LABOR, MD  morphine  (MS CONTIN ) 60 MG 12 hr tablet Take 60 mg by mouth every 12 (twelve) hours. 11/19/14   [provider]  Multiple Vitamin (MULTI-VITAMIN) tablet Take 1 tablet by mouth daily.    [provider]  oxyCODONE -acetaminophen (PERCOCET) 10-325 MG per tablet Take 1-3 tablets by mouth daily as needed for pain.    [provider]  pantoprazole  (PROTONIX ) 40 MG tablet TAKE 1 TABLET (40 MG TOTAL) BY MOUTH IN THE MORNING 08/20/23 05/16/24  [name] LABOR, MD  Syringe/Needle, Disp, (SYRINGE 3CC/21GX1-1/2) 21G X 1-1/2 3 ML MISC 1 application  by Does not apply route every 14 (fourteen) days. 03/14/22   [name] LABOR, MD  Syringe/Needle, Disp, (SYRINGE 3CC/25GX1) 25G X 1 3 ML MISC 1 Application by Does not apply route  every 14 (fourteen) days. 12/19/23   [name] LABOR, MD  testosterone  cypionate (DEPOTESTOSTERONE CYPIONATE) 200 MG/ML injection Inject 1 mL (200 mg total) into the muscle every 14 (fourteen) days. 01/29/24   [name] LABOR, MD    Allergies: Bupropion hcl, Etodolac, Hydrocodone  bit-homatrop mbr, Lisinopril , Perindopril erbumine, Dilaudid  [hydromorphone  hcl], and Morphine  and codeine    Review of Systems  Updated Vital Signs BP (!) 150/92 (BP Location: Left Arm)   Pulse 90   Temp 98 F (36.7 C)   Resp 18   Ht 5' 9 (1.753 m)   Wt 98 kg   SpO2 94%   BMI 31.90 kg/m   Physical Exam Vitals and nursing note reviewed.  Constitutional:      Appearance:  He is well-developed.  HENT:     Head: Normocephalic and atraumatic.  Eyes:     Conjunctiva/sclera: Conjunctivae normal.  Abdominal:     Palpations: Abdomen is soft.     Tenderness: There is no abdominal tenderness. There is no right CVA tenderness or left CVA tenderness.  Musculoskeletal:     Cervical back: Decreased range of motion (chronic).     Lumbar back: Spasms and tenderness present. No bony tenderness. Decreased range of motion.       Back:     Comments: No step-off noted with palpation of spine.   Skin:    General: Skin is warm and dry.  Neurological:     Mental Status: He is alert.     Sensory: No sensory deficit.     Motor: No abnormal muscle tone.     Comments: 5/5 strength in entire lower extremities bilaterally. No sensation deficit.   Psychiatric:        Mood and Affect: Mood normal.     (all labs ordered are listed, but only abnormal results are displayed) Labs Reviewed - No data to display  EKG: None  Radiology: No results found.   Procedures   Medications Ordered in the ED  ketorolac  (TORADOL ) 30 MG/ML injection 30 mg (30 mg Intramuscular Given 03/05/24 1727)  predniSONE  (DELTASONE ) tablet 60 mg (60 mg Oral Given 03/05/24 1727)    Clinical Course as of 03/05/24 1733  Wed Mar 05, 2024  1701 DG Lumbar Spine Complete [SS]  1704 DG Lumbar Spine Complete [SS]    Clinical Course User Index [SS] Stotelmyre, Sera, Student-PA    ED Course  Patient seen and examined. History obtained directly from patient. Reviewed previous imaging in Care everywhere.   Labs/EKG: None ordered  Imaging: Ordered lumbar x-Jesse  Medications/Fluids: Ordered: Toradol  30 mg IM, prednisone  p.o. 60 mg.  Most recent vital signs reviewed and are as follows: BP (!) 150/92 (BP Location: Left Arm)   Pulse 90   Temp 98 F (36.7 C)   Resp 18   Ht 5' 9 (1.753 m)   Wt 98 kg   SpO2 94%   BMI 31.90 kg/m   Initial impression: Low back pain, evaluate for hardware  complication.  ///  Reassessment performed. Patient appears stable.  Imaging personally visualized and interpreted including: Lumbar spine x-Jesse agreed interval fracture of left rod at L3-4.  Reviewed pertinent lab work and imaging with patient at bedside. Questions answered.   Most current vital signs reviewed and are as follows: BP (!) 150/92 (BP Location: Left Arm)   Pulse 90   Temp 98 F (36.7 C)   Resp 18   Ht 5' 9 (1.753 m)   Wt 98 kg   SpO2  94%   BMI 31.90 kg/m   I attempted to call the patient's neurosurgeon, Dr. Royden Cohen's office.  I spoke with assistant there.  They could not find patient records in the [facility] system.  Care everywhere shows previous records within the [facility] system.  They would not allow me to contact their group given patient not established.  I did speak with our on-call Dr. Mavis.  He agrees with outpatient follow-up with patient's surgeon.  Agrees no acute treatment required.  Plan: Discharge to home.   Prescriptions written for: Prednisone , this has helped patient in past.  Other home care instructions discussed: Rest, mobilization, no strenuous activities  ED return instructions discussed: New or worsening symptoms  Follow-up instructions discussed: Patient encouraged to call his surgeon tomorrow for follow-up.                                  Medical Decision Making Amount and/or Complexity of Data Reviewed Radiology: ordered.  Risk Prescription drug management.   Patient with acute back pain in setting of chronic back pain due to suspected fracture of left spinal rod.  No neuro deficits.  No new weakness, urinary incontinence, fecal incontinence, or other concerning features.  Pain is poorly controlled.  Treated with IM Toradol .  Reccs obtained. Outpatient follow-up advised.  Patient received Toradol  here.  He has morphine /oxycodone  at home.  Will give burst of prednisone .     Final diagnoses:  Acute left-sided low back  pain without sciatica  Status post lumbar spinal fusion    ED Discharge Orders          Ordered    predniSONE  (DELTASONE ) 20 MG tablet  Daily        03/05/24 1843               Desiderio Chew, PA-C 03/05/24 1915    Simon Lavonia SAILOR, MD 03/05/24 2242

## 2024-03-05 NOTE — ED Triage Notes (Signed)
 Pt reports bending over to pick up laundry basket and felt a pop in LT lower back, hx back surgery with hardware

## 2024-03-07 ENCOUNTER — Telehealth: Payer: Self-pay

## 2024-03-07 NOTE — Transitions of Care (Post Inpatient/ED Visit) (Signed)
 03/07/2024  Name: Jesse Hanna MRN: [id number] DOB: August 11, 1966  Today's TOC FU Call Status: Today's TOC FU Call Status:: Successful TOC FU Call Completed TOC FU Call Complete Date: 03/07/24 Patient's Name and Date of Birth confirmed.  Transition Care Management Follow-up Telephone Call Date of Discharge: 03/05/24 Discharge Facility: [facility] Type of Discharge: Emergency Department Reason for ED Visit: Other: How have you been since you were released from the hospital?: Same Any questions or concerns?: No  Items Reviewed: Did you receive and understand the discharge instructions provided?: Yes Medications obtained,verified, and reconciled?: Yes (Medications Reviewed) Any new allergies since your discharge?: No Dietary orders reviewed?: No Do you have support at home?: Yes People in Home [RPT]: spouse Name of Support/Comfort Primary Source: [name]  Medications Reviewed Today: Medications Reviewed Today     Reviewed by Elner Shan NOVAK, CMA (Certified Medical Assistant) on 03/07/24 at 1112  Med List Status: <None>   Medication Order Taking? Sig Documenting Provider Last Dose Status Informant  albuterol  (VENTOLIN  HFA) 108 (90 Base) MCG/ACT inhaler [id number] Yes Inhale 2 puffs into the lungs every 4 (four) hours as needed for wheezing or shortness of breath. [name] LABOR, MD  Active   ALPRAZolam  (XANAX ) 1 MG tablet [id number] Yes Take 1 tablet (1 mg total) by mouth 3 (three) times daily as needed for anxiety. [name] LABOR, MD  Active   amitriptyline (ELAVIL) 75 MG tablet [id number] Yes Take 75 mg by mouth at bedtime. [provider]  Active Self  aspirin  EC 81 MG tablet [id number] Yes Take 1 tablet (81 mg total) by mouth daily. Swallow whole. [name] LABOR, MD  Active   betamethasone  dipropionate 0.05 % cream [id number] Yes Apply topically 2 (two) times daily. [name] LABOR, MD  Active   cyanocobalamin  (VITAMIN B12) 1000 MCG/ML injection  [id number] Yes Inject 1 mL (1,000 mcg total) into the muscle every 14 (fourteen) days. [name] LABOR, MD  Active   EASY [facility] NEEDLES 22G X 1-1/2 MISC [id number] Yes USE EVERY 14 DAYS AS DIRECTED. [name] LABOR, MD  Active   famotidine  (PEPCID ) 40 MG tablet [id number] Yes TAKE 1 TABLET BY MOUTH EVERY DAY IN THE KARNA [name] LABOR, MD  Active   furosemide  (LASIX ) 20 MG tablet [id number] Yes Take 1 tablet (20 mg total) by mouth daily as needed for fluid. [name] LABOR, MD  Active   methocarbamol  (ROBAXIN ) 750 MG tablet [id number] Yes Take 750 mg by mouth 4 (four) times daily. [provider]  Active Self  metoprolol  succinate (TOPROL -XL) 50 MG 24 hr tablet [id number] Yes Take 1 tablet (50 mg total) by mouth daily. Take with or immediately following a meal. [name] LABOR, MD  Active   morphine  (MS CONTIN ) 60 MG 12 hr tablet [id number] Yes Take 60 mg by mouth every 12 (twelve) hours. [provider]  Active Self           Med Note ABBE, MISTY T   Wed Dec 16, 2014  1:59 PM) Received from: External Pharmacy  Multiple Vitamin (MULTI-VITAMIN) tablet [id number] Yes Take 1 tablet by mouth daily. [provider]  Active   oxyCODONE -acetaminophen (PERCOCET) 10-325 MG per tablet [id number] Yes Take 1-3 tablets by mouth daily as needed for pain. [provider]  Active Self  pantoprazole  (PROTONIX ) 40 MG tablet [id number] Yes TAKE 1 TABLET (40 MG TOTAL) BY MOUTH IN THE MORNING [name] LABOR, MD  Active   predniSONE  (DELTASONE )  20 MG tablet [id number] Yes Take 2 tablets (40 mg total) by mouth daily. Desiderio Chew, PA-C  Active   Syringe/Needle, Disp, (SYRINGE 3CC/21GX1-1/2) 21G X 1-1/2 3 ML MISC [id number] Yes 1 application  by Does not apply route every 14 (fourteen) days. [name] LABOR, MD  Active   Syringe/Needle, Disp, (SYRINGE 3CC/25GX1) 25G X 1 3 ML MISC [id number] Yes 1 Application by Does not apply route every 14 (fourteen) days. [name] LABOR, MD  Active    testosterone  cypionate (DEPOTESTOSTERONE CYPIONATE) 200 MG/ML injection [id number] Yes Inject 1 mL (200 mg total) into the muscle every 14 (fourteen) days. [name] LABOR, MD  Active             Home Care and Equipment/Supplies: Were Home Health Services Ordered?: No Any new equipment or medical supplies ordered?: No  Functional Questionnaire: Do you need assistance with bathing/showering or dressing?: No Do you need assistance with meal preparation?: No Do you need assistance with eating?: No Do you have difficulty maintaining continence: No Do you need assistance with getting out of bed/getting out of a chair/moving?: No Do you have difficulty managing or taking your medications?: No  Follow up appointments reviewed: PCP Follow-up appointment confirmed?: NA Specialist Hospital Follow-up appointment confirmed?: Yes Date of Specialist follow-up appointment?: 03/10/24 Follow-Up Specialty Provider:: surgon- cohen max MD Do you need transportation to your follow-up appointment?: No Do you understand care options if your condition(s) worsen?: Yes-patient verbalized understanding    SIGNATURE Cheney Ewart,CMA

## 2024-03-10 DIAGNOSIS — M5442 Lumbago with sciatica, left side: Secondary | ICD-10-CM | POA: Diagnosis not present

## 2024-03-10 DIAGNOSIS — Z1330 Encounter for screening examination for mental health and behavioral disorders, unspecified: Secondary | ICD-10-CM | POA: Diagnosis not present

## 2024-03-10 DIAGNOSIS — S32009K Unspecified fracture of unspecified lumbar vertebra, subsequent encounter for fracture with nonunion: Secondary | ICD-10-CM | POA: Diagnosis not present

## 2024-03-11 DIAGNOSIS — M5416 Radiculopathy, lumbar region: Secondary | ICD-10-CM | POA: Diagnosis not present

## 2024-03-11 DIAGNOSIS — M791 Myalgia, unspecified site: Secondary | ICD-10-CM | POA: Diagnosis not present

## 2024-03-11 DIAGNOSIS — M960 Pseudarthrosis after fusion or arthrodesis: Secondary | ICD-10-CM | POA: Diagnosis not present

## 2024-03-11 DIAGNOSIS — T84216A Breakdown (mechanical) of internal fixation device of vertebrae, initial encounter: Secondary | ICD-10-CM | POA: Diagnosis not present

## 2024-03-11 NOTE — Progress Notes (Signed)
 I personally interviewed and examined the patient. I reviewed and agree with the Physician Assistant's note and personally formulated the following plan:     Subjective:    Jesse Hanna is a 58 y.o. (DOB June 14, 1966) male.     Patient presents with  . Spine - Pain     Pain Associated symptoms include numbness and weakness. Pertinent negatives include no chest pain, chills, diaphoresis, fatigue or fever.   Last seen 01/03/2021 for right hip intra-articular injection with Dr. Darlean.   History of  revision T12-pelvis PSF, extension of fusion L3-S1 on 07/12/20 with Dr. Gust.  He reports he has been doing amazing since his last visit.  He had returned to nearly normal activities.  He was able to get out and plant in a small garden this year.  He was mowing his own lawn with his ride on mower.  He was very happy with his progress and his quality of life.  Unfortunately last Wednesday when he was bent over he felt and heard a loud pop in the left low back.  Pain immediately shot up and down his back and down into the left leg to the foot.  He has had severe pain in his left lower lower back since that time.  There is numbness in his left leg.  He was seen at the emergency room and given a Toradol  injection as well as a few days of prednisone .  He had mild relief with these treatments but he is still extremely limited in severe pain.  He has having to walk with a cane at this point.  Prior to this event last week he had been walking without any assistive devices.  Continues with paresthesias and numbness in his left lateral thigh extending down to the knee and coming into the anterior portion of his thigh.  Denies any other lower extremity symptoms.  Denies red flag signs or symptoms.  He continues working with pain management taking morphine  ER 60mg  2tab BID and Percocet 10/325mg  Q6H, robaxin  750mg  QID, Gabapentin 2tab TID to QID with mild relief-- rx'd with pain mgmt Dr. Cozetta. Unable to  tolerate PO NSAIDs due to GERD.     Reviewed and updated this visit by provider: None       Review of Systems  Constitutional:  Negative for activity change, appetite change, chills, diaphoresis, fatigue, fever and unexpected weight change.  Respiratory:  Negative for shortness of breath.   Cardiovascular:  Negative for chest pain.  Genitourinary:  Negative for difficulty urinating.  Neurological:  Positive for weakness and numbness.      Objective:   There were no vitals filed for this visit.  Physical Exam Constitutional:      General: He is in acute distress.     Appearance: Normal appearance. He is not ill-appearing or toxic-appearing.   Musculoskeletal:     Cervical back: No deformity.     Thoracic back: No deformity.     Lumbar back: Spasms and tenderness present. Decreased range of motion.       Back:  Neurological:     Mental Status: He is alert and oriented to person, place, and time.     Sensory: Sensory deficit present.     Motor: Weakness present.     Gait: Gait abnormal.  Patient in obvious discomfort.  Difficulty with transitional movements.  Using a cane for ambulation.  Antalgic gait.      Surgeries: -Revision T12-pelvis PSF, extension of fusion L3-S1 on 07/12/20  with Dr. Gust -T3-L3 PSF with osteotomies on 12/08/13, Dr. Gust -C4-6 ACDF ~2011, Dr. Gust.   Imaging: -X-rays lumbar spine done 02/2024 recently reviewed on canopy. Left rod fracture with displacement and sagittal plane just above left L3 pedicle screw.  This was not present on prior imaging. -CT scan L-spine from 2022. See reports for complete detail but no concerning findings.  Without clear fusion L5-S1.  -Scoliosis x-rays from 12/06/20 L3-S1 PSF with TLIF at L3-S1. hardware intact and well positioned, no loosening or complications. no acute complications noted.  Assessment / Plan:    Plan: At this point we discussed things at length. All questions were answered fully. Patient will  participate in activities as tolerated using pain as their guide. Patient was educated on current diagnosis and typical treatment algorithm.  Patient had a Toradol  injection done last week which some short-term relief.  He was given a couple days of steroids which were temporarily helpful.  Discussed treatment options.  He is not interested in considering injections at this point.  He is not able to participate in physical therapy due to severe pain.  He would like to get this fixed.  He will continue the pred pack we sent in yesterday. I am going to order a lumbar CT to evaluate the fusion.  Patient is completely miserable.  He cannot function.  He can barely walk.  His wife is with him advocating that something needs to be done.  I suspect that he has a pseudoarthrosis possibly multiple levels which ultimately led to the rod fracture.  We discussed that he may have a fracture or loose hardware on the contralateral side which cannot be visualized on the plain radiographs.  We will order a CT scan to further evaluate.  In the meantime he would like to go ahead and start the surgical planning process.  I think this is reasonable considering the severity of his pain and the clear evidence of acute rod fracture on plain radiographs.  From a surgical standpoint we discussed he would need to undergo revision and exploration of the instrumentation and fusion from T12 down to the pelvis.  The instrumentation and posterolateral arthrodesis would need to be revised.  We will consider possibly performing transforaminal lumbar interbody fusion at L2-3 based on intraoperative findings including the degree of posterior lateral fusion at this level and the technical feasibility of performing the TLIF at L2-3 considering the upper lumbar level and prior surgery.  We discussed the use of BMP and allograft.  We discussed realistic expectations.  Understands are no guarantees with surgery.  The pain may be new, different or worse  after the surgery.  We discussed that after completing the CT scan the surgical plan may be altered or modified as needed.    Dr. Gust spent over 45 minutes with patient face to face today discussing surgery. Dr. Gust showed th pt models and diagrams to describe surgery in detail. Discussed the prolonged recovery after this type of surgery and the typical hospital stay. Discussed that there are no guarantees with surgery. Pain may be no different or worse after the surgery. Discussed the risk of surgery including bleeding, infection, persistent pain, pseudoarthrosis, hardware failure, adjacent segment disease, proximal junctional kyphosis, malalignment, DVT, PE, nerve injury, dural tear, CSF leak, paralysis, need for additional surgery, medical complications, and complications from anesthesia.  After being educated on the surgical procedure and risks, patient would like to proceed with surgical scheduling for Revision L12-pelvis lami/PSF/possible TLIF. Reviewed  hospital course and post op expectations at length. Reviewed post operative activity and restrictions. Patient to f/u with Dr. Gust PA in office for H&P. He has a history of blood clots and is on ASA-he will need pcp clearance from Dr. Garnette Olmsted to stop the ASA.  He will speak with his pain management provider about if we will take over prescribing narcotics post op.    He will continue with pain management specialist for prescribing of chronic pain medications.We will review his lumbar CT at the H&P

## 2024-03-13 ENCOUNTER — Telehealth: Payer: Self-pay

## 2024-03-13 ENCOUNTER — Other Ambulatory Visit: Payer: Self-pay | Admitting: Family Medicine

## 2024-03-13 DIAGNOSIS — M4807 Spinal stenosis, lumbosacral region: Secondary | ICD-10-CM | POA: Diagnosis not present

## 2024-03-13 DIAGNOSIS — M960 Pseudarthrosis after fusion or arthrodesis: Secondary | ICD-10-CM | POA: Diagnosis not present

## 2024-03-13 DIAGNOSIS — M48061 Spinal stenosis, lumbar region without neurogenic claudication: Secondary | ICD-10-CM | POA: Diagnosis not present

## 2024-03-13 NOTE — Telephone Encounter (Signed)
 Pt request sent to PCP

## 2024-03-13 NOTE — Telephone Encounter (Signed)
 Copied from CRM [id number]. Topic: Clinical - Medication Question >> Mar 12, 2024  5:11 PM Shereese L wrote: Reason for CRM: patient is requesting to be taken off of aspirin  81mg  that he takes everyday but he can't take prior to surgery. Patient would like a call back in regards to next steps. He stated that paper work will be sent via mychart

## 2024-03-14 NOTE — Telephone Encounter (Signed)
 Pt notified of Dr Johnny advise to stop taking the Aspirin  7 days prior to his surgery. Surgical clearance form was faxed to [facility]

## 2024-03-14 NOTE — Telephone Encounter (Signed)
 Yes he can stop the ASA 7 days prior to surgery. I will look out for the paperwork

## 2024-04-02 DIAGNOSIS — G4700 Insomnia, unspecified: Secondary | ICD-10-CM | POA: Diagnosis not present

## 2024-04-02 DIAGNOSIS — G894 Chronic pain syndrome: Secondary | ICD-10-CM | POA: Diagnosis not present

## 2024-04-02 DIAGNOSIS — M62830 Muscle spasm of back: Secondary | ICD-10-CM | POA: Diagnosis not present

## 2024-04-02 DIAGNOSIS — M47817 Spondylosis without myelopathy or radiculopathy, lumbosacral region: Secondary | ICD-10-CM | POA: Diagnosis not present

## 2024-04-08 DIAGNOSIS — K219 Gastro-esophageal reflux disease without esophagitis: Secondary | ICD-10-CM | POA: Diagnosis not present

## 2024-04-08 DIAGNOSIS — Y838 Other surgical procedures as the cause of abnormal reaction of the patient, or of later complication, without mention of misadventure at the time of the procedure: Secondary | ICD-10-CM | POA: Diagnosis not present

## 2024-04-08 DIAGNOSIS — F419 Anxiety disorder, unspecified: Secondary | ICD-10-CM | POA: Diagnosis not present

## 2024-04-08 DIAGNOSIS — M960 Pseudarthrosis after fusion or arthrodesis: Secondary | ICD-10-CM | POA: Diagnosis not present

## 2024-04-08 DIAGNOSIS — J45909 Unspecified asthma, uncomplicated: Secondary | ICD-10-CM | POA: Diagnosis not present

## 2024-04-08 DIAGNOSIS — I10 Essential (primary) hypertension: Secondary | ICD-10-CM | POA: Diagnosis not present

## 2024-04-08 DIAGNOSIS — Z01812 Encounter for preprocedural laboratory examination: Secondary | ICD-10-CM | POA: Diagnosis not present

## 2024-04-08 DIAGNOSIS — Z01818 Encounter for other preprocedural examination: Secondary | ICD-10-CM | POA: Diagnosis not present

## 2024-04-11 DIAGNOSIS — M48062 Spinal stenosis, lumbar region with neurogenic claudication: Secondary | ICD-10-CM | POA: Diagnosis not present

## 2024-04-11 DIAGNOSIS — M791 Myalgia, unspecified site: Secondary | ICD-10-CM | POA: Diagnosis not present

## 2024-04-11 DIAGNOSIS — M5416 Radiculopathy, lumbar region: Secondary | ICD-10-CM | POA: Diagnosis not present

## 2024-04-11 DIAGNOSIS — M960 Pseudarthrosis after fusion or arthrodesis: Secondary | ICD-10-CM | POA: Diagnosis not present

## 2024-04-22 DIAGNOSIS — Z888 Allergy status to other drugs, medicaments and biological substances status: Secondary | ICD-10-CM | POA: Diagnosis not present

## 2024-04-22 DIAGNOSIS — F419 Anxiety disorder, unspecified: Secondary | ICD-10-CM | POA: Diagnosis not present

## 2024-04-22 DIAGNOSIS — K219 Gastro-esophageal reflux disease without esophagitis: Secondary | ICD-10-CM | POA: Diagnosis not present

## 2024-04-22 DIAGNOSIS — I10 Essential (primary) hypertension: Secondary | ICD-10-CM | POA: Diagnosis not present

## 2024-04-22 DIAGNOSIS — T84216A Breakdown (mechanical) of internal fixation device of vertebrae, initial encounter: Secondary | ICD-10-CM | POA: Diagnosis not present

## 2024-04-22 DIAGNOSIS — I82409 Acute embolism and thrombosis of unspecified deep veins of unspecified lower extremity: Secondary | ICD-10-CM | POA: Diagnosis not present

## 2024-04-22 DIAGNOSIS — Z6832 Body mass index (BMI) 32.0-32.9, adult: Secondary | ICD-10-CM | POA: Diagnosis not present

## 2024-04-22 DIAGNOSIS — Y831 Surgical operation with implant of artificial internal device as the cause of abnormal reaction of the patient, or of later complication, without mention of misadventure at the time of the procedure: Secondary | ICD-10-CM | POA: Diagnosis not present

## 2024-04-22 DIAGNOSIS — E669 Obesity, unspecified: Secondary | ICD-10-CM | POA: Diagnosis not present

## 2024-04-22 DIAGNOSIS — M5416 Radiculopathy, lumbar region: Secondary | ICD-10-CM | POA: Diagnosis not present

## 2024-04-22 DIAGNOSIS — F17210 Nicotine dependence, cigarettes, uncomplicated: Secondary | ICD-10-CM | POA: Diagnosis not present

## 2024-04-22 DIAGNOSIS — M960 Pseudarthrosis after fusion or arthrodesis: Secondary | ICD-10-CM | POA: Diagnosis not present

## 2024-04-22 DIAGNOSIS — J45909 Unspecified asthma, uncomplicated: Secondary | ICD-10-CM | POA: Diagnosis not present

## 2024-04-22 DIAGNOSIS — Z86718 Personal history of other venous thrombosis and embolism: Secondary | ICD-10-CM | POA: Diagnosis not present

## 2024-04-22 DIAGNOSIS — Z885 Allergy status to narcotic agent status: Secondary | ICD-10-CM | POA: Diagnosis not present

## 2024-04-22 DIAGNOSIS — Z981 Arthrodesis status: Secondary | ICD-10-CM | POA: Diagnosis not present

## 2024-04-22 DIAGNOSIS — S32009K Unspecified fracture of unspecified lumbar vertebra, subsequent encounter for fracture with nonunion: Secondary | ICD-10-CM | POA: Diagnosis not present

## 2024-04-22 DIAGNOSIS — T84498A Other mechanical complication of other internal orthopedic devices, implants and grafts, initial encounter: Secondary | ICD-10-CM | POA: Diagnosis not present

## 2024-04-29 DIAGNOSIS — M5416 Radiculopathy, lumbar region: Secondary | ICD-10-CM | POA: Diagnosis not present

## 2024-05-01 DIAGNOSIS — G894 Chronic pain syndrome: Secondary | ICD-10-CM | POA: Diagnosis not present

## 2024-05-01 DIAGNOSIS — G4700 Insomnia, unspecified: Secondary | ICD-10-CM | POA: Diagnosis not present

## 2024-05-01 DIAGNOSIS — M62830 Muscle spasm of back: Secondary | ICD-10-CM | POA: Diagnosis not present

## 2024-05-01 DIAGNOSIS — M47817 Spondylosis without myelopathy or radiculopathy, lumbosacral region: Secondary | ICD-10-CM | POA: Diagnosis not present

## 2024-05-22 DIAGNOSIS — M545 Low back pain, unspecified: Secondary | ICD-10-CM | POA: Diagnosis not present

## 2024-06-26 DIAGNOSIS — G894 Chronic pain syndrome: Secondary | ICD-10-CM | POA: Diagnosis not present

## 2024-06-26 DIAGNOSIS — G4700 Insomnia, unspecified: Secondary | ICD-10-CM | POA: Diagnosis not present

## 2024-06-26 DIAGNOSIS — M62830 Muscle spasm of back: Secondary | ICD-10-CM | POA: Diagnosis not present

## 2024-06-26 DIAGNOSIS — M47817 Spondylosis without myelopathy or radiculopathy, lumbosacral region: Secondary | ICD-10-CM | POA: Diagnosis not present

## 2024-07-10 DIAGNOSIS — M545 Low back pain, unspecified: Secondary | ICD-10-CM | POA: Diagnosis not present

## 2024-07-10 DIAGNOSIS — M791 Myalgia, unspecified site: Secondary | ICD-10-CM | POA: Diagnosis not present

## 2024-07-22 DIAGNOSIS — M47817 Spondylosis without myelopathy or radiculopathy, lumbosacral region: Secondary | ICD-10-CM | POA: Diagnosis not present

## 2024-07-22 DIAGNOSIS — G4700 Insomnia, unspecified: Secondary | ICD-10-CM | POA: Diagnosis not present

## 2024-07-22 DIAGNOSIS — M62830 Muscle spasm of back: Secondary | ICD-10-CM | POA: Diagnosis not present

## 2024-07-22 DIAGNOSIS — G894 Chronic pain syndrome: Secondary | ICD-10-CM | POA: Diagnosis not present

## 2024-08-12 ENCOUNTER — Other Ambulatory Visit: Payer: Self-pay | Admitting: Family Medicine

## 2024-09-11 ENCOUNTER — Other Ambulatory Visit: Payer: Self-pay | Admitting: Family Medicine

## 2024-09-25 ENCOUNTER — Ambulatory Visit: Payer: Self-pay | Admitting: Family Medicine

## 2024-09-25 DIAGNOSIS — Z0000 Encounter for general adult medical examination without abnormal findings: Secondary | ICD-10-CM

## 2024-09-25 NOTE — Patient Instructions (Signed)
 I really enjoyed getting to talk with you today! I am available on Tuesdays and Thursdays for virtual visits if you have any questions or concerns, or if I can be of any further assistance.   CHECKLIST FROM ANNUAL WELLNESS VISIT:  -Follow up (please call to schedule if not scheduled after visit):   -yearly for annual wellness visit with primary care office  Here is a list of your preventive care/health maintenance measures and the plan for each if any are due:  PLAN For any measures below that may be due:    1. Please call to schedule your colonoscopy with your gastroenterologist.   2. Can get labs with Dr. Johnny at next visit.   3. If you decide to get any vaccines - can do so at the pharmacy.  Health Maintenance  Topic Date Due   HIV Screening  Never done   Hepatitis C Screening  Never done   Pneumococcal Vaccine: 50+ Years (1 of 2 - PCV) Never done   Hepatitis B Vaccines 19-59 Average Risk (1 of 3 - 19+ 3-dose series) Never done   Colonoscopy  Never done   Zoster Vaccines- Shingrix (1 of 2) Never done   Influenza Vaccine  03/28/2024   COVID-19 Vaccine (3 - 2025-26 season) 04/28/2024   DTaP/Tdap/Td (2 - Td or Tdap) 11/11/2031   HPV VACCINES (No Doses Required) Completed   Meningococcal B Vaccine  Aged Out    -See a dentist at least yearly  -Get your eyes checked and then per your eye specialist's recommendations  -Other issues addressed today:   1. Please quit smoking. See information below.    -I have included below further information regarding a healthy whole foods based diet, physical activity guidelines for adults, stress management and opportunities for social connections. I hope you find this information useful.    -----------------------------------------------------------------------------------------------------------------------------------------------------------------------------------------------------------------------------------------------------------    NUTRITION: -eat real food: lots of colorful vegetables (half the plate) and fruits -5-7 servings of vegetables and fruits per day (fresh or steamed is best), exp. 2 servings of vegetables with lunch and dinner and 2 servings of fruit per day. Berries and greens such as kale and collards are great choices.  -consume on a regular basis:  fresh fruits, fresh veggies, fish, nuts, seeds, healthy oils (such as olive oil, avocado oil), whole grains (make sure for bread/pasta/crackers/etc., that the first ingredient on label contains the word whole), legumes. -can eat small amounts of dairy and lean meat (no larger than the palm of your hand), but avoid processed meats such as ham, bacon, lunch meat, etc. -drink water -try to avoid fast food and pre-packaged foods, processed meat, ultra processed foods/beverages (donuts, candy, etc.) -most experts advise limiting sodium to < 2300mg  per day, should limit further is any chronic conditions such as high blood pressure, heart disease, diabetes, etc. The American Heart Association advised that < 1500mg  is is ideal -try to avoid foods/beverages that contain any ingredients with names you do not recognize  -try to avoid foods/beverages  with added sugar or sweeteners/sweets  -try to avoid sweet drinks (including diet drinks): soda, juice, Gatorade, sweet tea, power drinks, diet drinks -try to avoid white rice, white bread, pasta (unless whole grain)  EXERCISE GUIDELINES FOR ADULTS: -if you wish to increase your physical activity, do so gradually and with the approval of your doctor -STOP and seek medical care immediately if you have any chest pain, chest discomfort or trouble breathing when starting or  increasing exercise  -move and  stretch your body, legs, feet and arms when sitting for long periods -Physical activity guidelines for optimal health in adults: -get at least 150 minutes per week of moderate exercise (can talk, but not sing); this is about 20-30 minutes of sustained activity 5-7 days per week or two 10-15 minute episodes of sustained activity 5-7 days per week -do some muscle building/resistance training/strength training at least 2 days per week  -balance exercises 3+ days per week:   Stand somewhere where you have something sturdy to hold onto if you lose balance    1) lift up on toes, then back down, start with 5x per day and work up to 20x   2) stand and lift one leg straight out to the side so that foot is a few inches of the floor, start with 5x each side and work up to 20x each side   3) stand on one foot, start with 5 seconds each side and work up to 20 seconds on each side  If you need ideas or help with getting more active:  -Silver sneakers https://tools.silversneakers.com  -Walk with a Doc: Http://www.duncan-williams.com/  -try to include resistance (weight lifting/strength building) and balance exercises twice per week: or the following link for ideas: http://castillo-powell.com/  buyducts.dk  STRESS MANAGEMENT: -can try meditating, or just sitting quietly with deep breathing while intentionally relaxing all parts of your body for 5 minutes daily -if you need further help with stress, anxiety or depression please follow up with your primary doctor or contact the wonderful folks at [facility]: [phone]  SOCIAL CONNECTIONS: -options in [place] if you wish to engage in more social and exercise related activities:  -Silver sneakers https://tools.silversneakers.com  -Walk with a Doc: Http://www.duncan-williams.com/  -Check out the [facility] Active Adults 50+  section on the [place] [employer] (hiking clubs, book clubs, cards and games, chess, exercise classes, aquatic classes and much more) - see the website for details: https://www.[place].gov/departments/parks-recreation/active-adults50  -YouTube has lots of exercise videos for different ages and abilities as well  -[facility] (a variety of indoor and outdoor inperson activities for adults). (415)396-6931. [address].  -Virtual Online Classes (a variety of topics): see seniorplanet.org or call (989)806-3721  -consider volunteering at a school, hospice center, church, senior center or elsewhere   Quitting Smoking: How to Manage Quitting smoking is a physical and mental challenge. You may have cravings and feel tempted to smoke. Before quitting, work with your health care provider to make a plan that can help you manage quitting. Making a plan before you quit may keep you from smoking when you have the urge to smoke while trying to quit. If you don't succeed in quitting the first time, don't give up. Some people need to try many times before they can quit. Do your best to stick to your plan to quit. Talk with your provider if you have any questions or concerns. How to manage lifestyle changes Managing stress Stress can make you want to smoke. And wanting to smoke may cause stress. It's important to find ways to manage your stress. Here are some things that can help: Practice relaxation techniques. Breathe slowly and deeply, in through your nose and out through your mouth. Listen to music. Soak in a bath or take a shower. Imagine a peaceful place or vacation. Get some support. Talk with family or friends about your stress. Join a support group. Talk with a counselor or therapist. Get some physical activity. Go for a walk,  run, or bike ride. Play a favorite sport. Practice yoga.  Medicines Talk with your provider about medicines that might help you deal  with cravings and make quitting easier for you. Relationships Social situations can be hard when you're quitting smoking. To manage this, you can: Avoid parties and other places where people might be smoking. Avoid alcohol. Leave right away if you have the urge to smoke. Tell your family and friends that you're quitting smoking. Ask for support and let them know you might be a bit grumpy. Plan activities where smoking is not an option. How to manage cravings Come up with a plan for how to deal with your cravings. The plan should include: A definition of the specific situation you want to deal with. An activity or action you'll take to replace smoking. A clear idea for how this action will help. The name of someone who could help you with this. Cravings usually last for 5-10 minutes. Consider taking these actions to help you with your plan to deal with cravings: Keep your mouth busy. Chew sugar-free gum. Suck on hard candies or a straw. Brush your teeth. Keep your hands and body busy. Change to a different activity right away. Squeeze or play with a ball. Do an activity or a hobby, such as making bead jewelry, doing needlepoint, or working with wood. Mix up your normal routine. Take a short exercise break. Go for a quick walk, or run up and down stairs. Focus on doing something kind or helpful for someone else. Call a friend or family member to talk during a craving. Join a support group. Contact a quitline. Where to find support To get help or find a support group: Call the National Cancer Institute's Smoking Quitline: 1-800-QUIT-NOW 440-607-7489) Text QUIT to SmokefreeTXT: 52151 Where to find more information To learn more, go to these websites: U.S. Department of Health and Human Services: smokefree.gov American Lung Association: freedomfromsmoking.org Centers for Disease Control and Prevention at tonerpromos.no. Then: Click Search and type smoking. Find the link you need. Contact  a health care provider if: You want to change your plan for quitting. The medicines you're taking are not helping. Your eating feels out of control or you can't sleep. You feel depressed or become very anxious. This information is not intended to replace advice given to you by your health care provider. Make sure you discuss any questions you have with your health care provider. Document Revised: 02/22/2024 Document Reviewed: 02/22/2024 Elsevier Patient Education  2025 [employer].

## 2024-09-25 NOTE — Progress Notes (Signed)
 " ----------------------------------------------------------------------------------------------------------------------------------------------------------------------------------------------------------------------  Because this visit was a virtual/telehealth visit, some criteria may be missing or patient reported. Any vitals not documented were not able to be obtained and vitals that have been documented are patient reported.    MEDICARE ANNUAL PREVENTIVE CARE VISIT WITH PROVIDER (Welcome to Medicare, initial annual wellness or annual wellness exam)  Virtual Visit via Phone Note  I connected with Jesse Hanna on 09/25/24  by phone and verified that I am speaking with the correct person using two identifiers. Video worked - but could not get audio to work with the video so transferred to phone.   Location patient: home Location provider:work or home office Persons participating in the virtual visit: patient, provider  Concerns and/or follow up today: detailed intake and health/risks assessment completed on flow sheets and below- please see for details. Stable - trying to stay active. Deals with a lot of chronic pain related to a history of numerous back surgeries. Sees pain management specialist.   How often do you have a drink containing alcohol?n How many drinks containing alcohol do you have on a typical day when you are drinking?na How often do you have six or more drinks on one occasion?na Have you ever smoked?y Quit date if applicable? Currently cutting back and is down to a few cigarette per day. Planning to completely quit.   How many packs a day do/did you smoke? n Do you use smokeless tobacco?n Do you use an illicit drugs?n Last dentist visit?went last year Last eye Exam and location?plans to make appointment, Dr. Milissa   See HM section in Epic for other details of completed HM.    ROS: negative for report of fevers, unintentional weight loss, vision changes, vision  loss, hearing loss or change, chest pain, sob, hemoptysis, melena, hematochezia, hematuria,bleeding or bruising  Patient-completed extensive health risk assessment - reviewed and discussed with the patient: See Health Risk Assessment completed with patient prior to the visit either above or in recent phone note. This was reviewed in detailed with the patient today and appropriate recommendations, orders and referrals were placed as needed per Summary below and patient instructions.   Review of Medical History: -PMH, PSH, Family History and current specialty and care providers reviewed and updated and listed below   Patient Care Team: [name] LABOR, MD as PCP - General   Past Medical History:  Diagnosis Date   Anxiety    Chronic pain disorder    sees Dr. Ronal Beard    GERD (gastroesophageal reflux disease)    Hypertension    Skin cancer     Past Surgical History:  Procedure Laterality Date   CARPAL TUNNEL RELEASE  02/06/2012   Procedure: CARPAL TUNNEL RELEASE;  Surgeon: Maude KANDICE Herald, MD;  Location: [facility];  Service: Orthopedics;  Laterality: Left;   COLONOSCOPY  01 25 2005   normal per Dr. Abran   CORONARY ANGIOPLASTY  2003   per Dr. Lavon, normal-not had to go back   epidural steroid shots     to thoracic spine   ESOPHAGOGASTRODUODENOSCOPY  01 05 2005   per Dr. Abran showed  [place] only   HERNIA REPAIR     right x 2   left foot neuroma     ROTATOR CUFF REPAIR     left and right   SPINAL FUSION     x3 on 12/15/07 on C6-7 and repeat on 05/16/10 then on L5-S1 on 08/23/09 per Dr. Gust   SPINAL  FUSION  12-08-13   from T3 to L3 per Dr. Royden Schneider     Social History   Socioeconomic History   Marital status: Married    Spouse name: Not on file   Number of children: 1   Years of education: Not on file   Highest education level: 12th grade  Occupational History    Employer: [employer]  Tobacco Use   Smoking status: Every Day    Current  packs/day: 0.50    Average packs/day: 0.5 packs/day for 15.0 years (7.5 ttl pk-yrs)    Types: Cigarettes   Smokeless tobacco: Never  Vaping Use   Vaping status: Never Used  Substance and Sexual Activity   Alcohol use: No    Alcohol/week: 0.0 standard drinks of alcohol   Drug use: No   Sexual activity: Not on file  Other Topics Concern   Not on file  Social History Narrative   Married         Social Drivers of Health   Tobacco Use: High Risk (07/10/2024)   Received from [facility]   Patient History    Smoking Tobacco Use: Every Day    Smokeless Tobacco Use: Never    Passive Exposure: Never  Financial Resource Strain: Medium Risk (09/24/2024)   Overall Financial Resource Strain (CARDIA)    Difficulty of Paying Living Expenses: Somewhat hard  Food Insecurity: No Food Insecurity (09/24/2024)   Epic    Worried About Radiation Protection Practitioner of Food in the Last Year: Never true    Ran Out of Food in the Last Year: Never true  Transportation Needs: No Transportation Needs (09/24/2024)   Epic    Lack of Transportation (Medical): No    Lack of Transportation (Non-Medical): No  Physical Activity: Insufficiently Active (09/24/2024)   Exercise Vital Sign    Days of Exercise per Week: 2 days    Minutes of Exercise per Session: 20 min  Stress: No Stress Concern Present (09/24/2024)   [employer] of Occupational Health - Occupational Stress Questionnaire    Feeling of Stress: Only a little  Social Connections: Unknown (09/24/2024)   Social Connection and Isolation Panel    Frequency of Communication with Friends and Family: More than three times a week    Frequency of Social Gatherings with Friends and Family: Twice a week    Attends Religious Services: More than 4 times per year    Active Member of [employer] or Organizations: Patient declined    Attends Banker Meetings: Not on file    Marital Status: Married  Intimate Partner Violence: Not At Risk (04/22/2024)   Received from  [facility]   HITS    Over the last 12 months how often did your partner physically hurt you?: Never    Over the last 12 months how often did your partner insult you or talk down to you?: Never    Over the last 12 months how often did your partner threaten you with physical harm?: Never    Over the last 12 months how often did your partner scream or curse at you?: Never  Depression (PHQ2-9): Low Risk (09/25/2024)   Depression (PHQ2-9)    PHQ-2 Score: 0  Alcohol Screen: Not on file  Housing: Low Risk (09/24/2024)   Epic    Unable to Pay for Housing in the Last Year: No    Number of Times Moved in the Last Year: 0    Homeless in the Last Year: No  Utilities: Not  At Risk (04/22/2024)   [facility]    In the past 12 months has the electric, gas, oil, or water company threatened to shut off services in your home?: No  Health Literacy: Not on file    Family History  Problem Relation Age of Onset   Hypertension Other    Cancer Father        oral   Tuberculosis Father    Colon polyps Mother     Medications Ordered Prior to Encounter[1]  Allergies[2]     Physical Exam Vitals requested from patient and listed below if patient had equipment and was able to obtain at home for this virtual visit: There were no vitals filed for this visit. Estimated body mass index is 31.9 kg/m as calculated from the following:   Height as of 03/05/24: 5' 9 (1.753 m).   Weight as of 03/05/24: 216 lb (98 kg).  EKG (optional): deferred due to virtual visit  GENERAL: alert, oriented, no acute distress detected; full vision exam deferred due to pandemic and/or virtual encounter   HEENT: atraumatic, conjunttiva clear, no obvious abnormalities on inspection of external nose and ears  NECK: normal movements of the head and neck  LUNGS: on inspection no signs of respiratory distress, breathing rate appears normal, no obvious gross SOB, gasping or wheezing  CV: no obvious  cyanosis  MS: moves all visible extremities without noticeable abnormality  PSYCH/NEURO: pleasant and cooperative, no obvious depression or anxiety, speech and thought processing grossly intact, Cognitive function grossly intact  Flowsheet Row Office Visit from 05/17/2022 in [facility] [place]  PHQ-9 Total Score 2        09/25/2024    5:09 PM 05/17/2022   11:21 AM 04/20/2022    8:37 AM 11/10/2021    9:06 AM 10/25/2021    9:44 AM  Depression screen PHQ 2/9  Decreased Interest 0 0 0 0 0  Down, Depressed, Hopeless 0 0 0 0 0  PHQ - 2 Score 0 0 0 0 0  Altered sleeping  0 0 0 1  Tired, decreased energy  2 1 0 0  Change in appetite  0 0 0 0  Feeling bad or failure about yourself   0 0 0 0  Trouble concentrating  0 0 0 0  Moving slowly or fidgety/restless  0 0 0 0  Suicidal thoughts  0 0 0 0  PHQ-9 Score  2  1  0  1   Difficult doing work/chores  Not difficult at all Not difficult at all       Data saved with a previous flowsheet row definition       04/14/2022    3:14 PM 04/20/2022    8:36 AM 05/17/2022   11:21 AM 09/24/2024   11:30 PM 09/25/2024    4:50 PM  Fall Risk  Falls in the past year?  1 0 0    Was there an injury with Fall?  1  0   0  Was there an injury with Fall? - Comments  skin tear      Fall Risk Category Calculator  2 0    Fall Risk Category (Retired)  Moderate  Low     (RETIRED) Patient Fall Risk Level Low fall risk  Low fall risk  Low fall risk     Patient at Risk for Falls Due to  No Fall Risks No Fall Risks  No Fall Risks  Fall risk  Follow up  Falls evaluation completed  Falls evaluation completed   Falls evaluation completed     Manually entered by patient   Data saved with a previous flowsheet row definition     SUMMARY AND PLAN:  Encounter for Medicare annual wellness exam   Discussed applicable health maintenance/preventive health measures and advised and referred or ordered per patient preferences: -declines vaccines, but  reports he is wearing masks if goes out -he says he will call his gastroenterologist to schedule his colonoscopy -discussed labs screenings Health Maintenance  Topic Date Due   HIV Screening  Never done   Hepatitis C Screening  Never done   Pneumococcal Vaccine: 50+ Years (1 of 2 - PCV) Never done   Hepatitis B Vaccines 19-59 Average Risk (1 of 3 - 19+ 3-dose series) Never done   Colonoscopy  Never done   Zoster Vaccines- Shingrix (1 of 2) Never done   Influenza Vaccine  03/28/2024   COVID-19 Vaccine (3 - 2025-26 season) 04/28/2024   DTaP/Tdap/Td (2 - Td or Tdap) 11/11/2031   HPV VACCINES (No Doses Required) Completed   Meningococcal B Vaccine  Aged Out     Education and counseling on the following was provided based on the above review of health and a plan/checklist for the patient, along with additional information discussed, was provided for the patient in the patient instructions :     -Advised and counseled on a healthy lifestyle - including the importance of a healthy diet, regular physical activity, social connections and stress management. -Reviewed patient's current diet. Advised and counseled on a whole foods based healthy diet. A summary of a healthy diet was provided in the Patient Instructions.  -reviewed patient's current physical activity level and discussed exercise guidelines for adults. Discussed community resources and ideas for safe exercise at home to assist in meeting exercise guideline recommendations in a safe and healthy way.  -Advise yearly dental visits at minimum and regular eye exams -Advised and counseled on  tobacco use, risks of smoking and offered counseling/help; he sees pain specialist for pain management  Follow up: see patient instructions   Patient Instructions  I really enjoyed getting to talk with you today! I am available on Tuesdays and Thursdays for virtual visits if you have any questions or concerns, or if I can be of any further  assistance.   CHECKLIST FROM ANNUAL WELLNESS VISIT:  -Follow up (please call to schedule if not scheduled after visit):   -yearly for annual wellness visit with primary care office  Here is a list of your preventive care/health maintenance measures and the plan for each if any are due:  PLAN For any measures below that may be due:    1. Please call to schedule your colonoscopy with your gastroenterologist.   2. Can get labs with Dr. Johnny at next visit.   3. If you decide to get any vaccines - can do so at the pharmacy.  Health Maintenance  Topic Date Due   HIV Screening  Never done   Hepatitis C Screening  Never done   Pneumococcal Vaccine: 50+ Years (1 of 2 - PCV) Never done   Hepatitis B Vaccines 19-59 Average Risk (1 of 3 - 19+ 3-dose series) Never done   Colonoscopy  Never done   Zoster Vaccines- Shingrix (1 of 2) Never done   Influenza Vaccine  03/28/2024   COVID-19 Vaccine (3 - 2025-26 season) 04/28/2024   DTaP/Tdap/Td (2 - Td or Tdap) 11/11/2031  HPV VACCINES (No Doses Required) Completed   Meningococcal B Vaccine  Aged Out    -See a dentist at least yearly  -Get your eyes checked and then per your eye specialist's recommendations  -Other issues addressed today:   1. Please quit smoking. See information below.    -I have included below further information regarding a healthy whole foods based diet, physical activity guidelines for adults, stress management and opportunities for social connections. I hope you find this information useful.   -----------------------------------------------------------------------------------------------------------------------------------------------------------------------------------------------------------------------------------------------------------    NUTRITION: -eat real food: lots of colorful vegetables (half the plate) and fruits -5-7 servings of vegetables and fruits per day (fresh or steamed is best), exp. 2 servings  of vegetables with lunch and dinner and 2 servings of fruit per day. Berries and greens such as kale and collards are great choices.  -consume on a regular basis:  fresh fruits, fresh veggies, fish, nuts, seeds, healthy oils (such as olive oil, avocado oil), whole grains (make sure for bread/pasta/crackers/etc., that the first ingredient on label contains the word whole), legumes. -can eat small amounts of dairy and lean meat (no larger than the palm of your hand), but avoid processed meats such as ham, bacon, lunch meat, etc. -drink water -try to avoid fast food and pre-packaged foods, processed meat, ultra processed foods/beverages (donuts, candy, etc.) -most experts advise limiting sodium to < 2300mg  per day, should limit further is any chronic conditions such as high blood pressure, heart disease, diabetes, etc. The American Heart Association advised that < 1500mg  is is ideal -try to avoid foods/beverages that contain any ingredients with names you do not recognize  -try to avoid foods/beverages  with added sugar or sweeteners/sweets  -try to avoid sweet drinks (including diet drinks): soda, juice, Gatorade, sweet tea, power drinks, diet drinks -try to avoid white rice, white bread, pasta (unless whole grain)  EXERCISE GUIDELINES FOR ADULTS: -if you wish to increase your physical activity, do so gradually and with the approval of your doctor -STOP and seek medical care immediately if you have any chest pain, chest discomfort or trouble breathing when starting or increasing exercise  -move and stretch your body, legs, feet and arms when sitting for long periods -Physical activity guidelines for optimal health in adults: -get at least 150 minutes per week of moderate exercise (can talk, but not sing); this is about 20-30 minutes of sustained activity 5-7 days per week or two 10-15 minute episodes of sustained activity 5-7 days per week -do some muscle building/resistance training/strength  training at least 2 days per week  -balance exercises 3+ days per week:   Stand somewhere where you have something sturdy to hold onto if you lose balance    1) lift up on toes, then back down, start with 5x per day and work up to 20x   2) stand and lift one leg straight out to the side so that foot is a few inches of the floor, start with 5x each side and work up to 20x each side   3) stand on one foot, start with 5 seconds each side and work up to 20 seconds on each side  If you need ideas or help with getting more active:  -Silver sneakers https://tools.silversneakers.com  -Walk with a Doc: Http://www.duncan-williams.com/  -try to include resistance (weight lifting/strength building) and balance exercises twice per week: or the following link for ideas: http://castillo-powell.com/  buyducts.dk  STRESS MANAGEMENT: -can try meditating, or just sitting quietly with deep breathing while intentionally  relaxing all parts of your body for 5 minutes daily -if you need further help with stress, anxiety or depression please follow up with your primary doctor or contact the wonderful folks at [facility]: [phone]  SOCIAL CONNECTIONS: -options in [place] if you wish to engage in more social and exercise related activities:  -Silver sneakers https://tools.silversneakers.com  -Walk with a Doc: Http://www.duncan-williams.com/  -Check out the [facility] Active Adults 50+ section on the [place] of [employer] companies (hiking clubs, book clubs, cards and games, chess, exercise classes, aquatic classes and much more) - see the website for details: https://www.[place].gov/departments/parks-recreation/active-adults50  -YouTube has lots of exercise videos for different ages and abilities as well  -[facility] (a variety of indoor and outdoor inperson activities for adults).  ([phone]. [address].  -Virtual Online Classes (a variety of topics): see seniorplanet.org or call [phone]  -consider volunteering at a school, hospice center, church, senior center or elsewhere   Quitting Smoking: How to Manage Quitting smoking is a physical and mental challenge. You may have cravings and feel tempted to smoke. Before quitting, work with your health care provider to make a plan that can help you manage quitting. Making a plan before you quit may keep you from smoking when you have the urge to smoke while trying to quit. If you don't succeed in quitting the first time, don't give up. Some people need to try many times before they can quit. Do your best to stick to your plan to quit. Talk with your provider if you have any questions or concerns. How to manage lifestyle changes Managing stress Stress can make you want to smoke. And wanting to smoke may cause stress. It's important to find ways to manage your stress. Here are some things that can help: Practice relaxation techniques. Breathe slowly and deeply, in through your nose and out through your mouth. Listen to music. Soak in a bath or take a shower. Imagine a peaceful place or vacation. Get some support. Talk with family or friends about your stress. Join a support group. Talk with a counselor or therapist. Get some physical activity. Go for a walk, run, or bike ride. Play a favorite sport. Practice yoga.  Medicines Talk with your provider about medicines that might help you deal with cravings and make quitting easier for you. Relationships Social situations can be hard when you're quitting smoking. To manage this, you can: Avoid parties and other places where people might be smoking. Avoid alcohol. Leave right away if you have the urge to smoke. Tell your family and friends that you're quitting smoking. Ask for support and let them know you might be a bit grumpy. Plan activities where  smoking is not an option. How to manage cravings Come up with a plan for how to deal with your cravings. The plan should include: A definition of the specific situation you want to deal with. An activity or action you'll take to replace smoking. A clear idea for how this action will help. The name of someone who could help you with this. Cravings usually last for 5-10 minutes. Consider taking these actions to help you with your plan to deal with cravings: Keep your mouth busy. Chew sugar-free gum. Suck on hard candies or a straw. Brush your teeth. Keep your hands and body busy. Change to a different activity right away. Squeeze or play with a ball. Do an activity or a hobby, such as making bead jewelry, doing needlepoint, or working with wood.  Mix up your normal routine. Take a short exercise break. Go for a quick walk, or run up and down stairs. Focus on doing something kind or helpful for someone else. Call a friend or family member to talk during a craving. Join a support group. Contact a quitline. Where to find support To get help or find a support group: Call the National Cancer Institute's Smoking Quitline: 1-800-QUIT-NOW 727-197-6224) Text QUIT to SmokefreeTXT: 52151 Where to find more information To learn more, go to these websites: U.S. Department of Health and Human Services: smokefree.gov American Lung Association: freedomfromsmoking.org Centers for Disease Control and Prevention at tonerpromos.no. Then: Click Search and type smoking. Find the link you need. Contact a health care provider if: You want to change your plan for quitting. The medicines you're taking are not helping. Your eating feels out of control or you can't sleep. You feel depressed or become very anxious. This information is not intended to replace advice given to you by your health care provider. Make sure you discuss any questions you have with your health care provider. Document Revised: 02/22/2024  Document Reviewed: 02/22/2024 Elsevier Patient Education  2025 [employer].      Chiquita JONELLE Cramp, DO     [1]  Current Outpatient Medications on File Prior to Visit  Medication Sig Dispense Refill   albuterol  (VENTOLIN  HFA) 108 (90 Base) MCG/ACT inhaler Inhale 2 puffs into the lungs every 4 (four) hours as needed for wheezing or shortness of breath. 18 each 11   ALPRAZolam  (XANAX ) 1 MG tablet TAKE 1 TABLET BY MOUTH 3 TIMES DAILY AS NEEDED FOR ANXIETY. 90 tablet 5   amitriptyline (ELAVIL) 75 MG tablet Take 75 mg by mouth at bedtime.     aspirin  EC 81 MG tablet Take 1 tablet (81 mg total) by mouth daily. Swallow whole. 30 tablet 0   betamethasone  dipropionate 0.05 % cream Apply topically 2 (two) times daily. 45 g 11   cyanocobalamin  (VITAMIN B12) 1000 MCG/ML injection Inject 1 mL (1,000 mcg total) into the muscle every 14 (fourteen) days. 10 mL 11   EASY TOUCH FLIPLOCK NEEDLES 22G X 1-1/2 MISC USE EVERY 14 DAYS AS DIRECTED. 50 each 1   famotidine  (PEPCID ) 40 MG tablet TAKE 1 TABLET BY MOUTH EVERY DAY IN THE EVENING 90 tablet 3   furosemide  (LASIX ) 20 MG tablet Take 1 tablet (20 mg total) by mouth daily as needed for fluid. 60 tablet 5   methocarbamol  (ROBAXIN ) 750 MG tablet Take 750 mg by mouth 4 (four) times daily.     metoprolol  succinate (TOPROL -XL) 50 MG 24 hr tablet Take 1 tablet (50 mg total) by mouth daily. Take with or immediately following a meal. (Patient not taking: Reported on 09/25/2024) 90 tablet 0   morphine  (MS CONTIN ) 60 MG 12 hr tablet Take 60 mg by mouth every 12 (twelve) hours.  0   Multiple Vitamin (MULTI-VITAMIN) tablet Take 1 tablet by mouth daily.     oxyCODONE -acetaminophen (PERCOCET) 10-325 MG per tablet Take 1-3 tablets by mouth daily as needed for pain.     pantoprazole  (PROTONIX ) 40 MG tablet TAKE 1 TABLET (40 MG TOTAL) BY MOUTH IN THE MORNING 90 tablet 3   predniSONE  (DELTASONE ) 20 MG tablet Take 2 tablets (40 mg total) by mouth daily. 8 tablet 0    Syringe/Needle, Disp, (SYRINGE 3CC/21GX1-1/2) 21G X 1-1/2 3 ML MISC 1 application  by Does not apply route every 14 (fourteen) days. 50 each 2   Syringe/Needle, Disp, (SYRINGE  3CC/25GX1) 25G X 1 3 ML MISC 1 Application by Does not apply route every 14 (fourteen) days. 50 each 5   testosterone  cypionate (DEPOTESTOSTERONE CYPIONATE) 200 MG/ML injection Inject 1 mL (200 mg total) into the muscle every 14 (fourteen) days. 10 mL 1   No current facility-administered medications on file prior to visit.  [2]  Allergies Allergen Reactions   Bupropion Hcl     REACTION: unspecified   Etodolac    Hydrocodone  Bit-Homatrop Mbr     Cough syrup   Lisinopril  Other (See Comments)    angioedema   Perindopril Erbumine     REACTION: unspecified   Dilaudid  [Hydromorphone  Hcl] Other (See Comments)    Dizzy and burning on the skin   Morphine  And Codeine Nausea Only    GI upset   "
